# Patient Record
Sex: Male | Born: 1995 | Race: Black or African American | Hispanic: No | Marital: Single | Smoking: Never smoker
Health system: Southern US, Community
[De-identification: ages and names within clinical notes are randomized; demographics above are authoritative.]

## PROBLEM LIST (undated history)

## (undated) ENCOUNTER — Ambulatory Visit: Admission: EM | Payer: Medicaid Other | Source: Home / Self Care

## (undated) DIAGNOSIS — F909 Attention-deficit hyperactivity disorder, unspecified type: Secondary | ICD-10-CM

## (undated) DIAGNOSIS — J45909 Unspecified asthma, uncomplicated: Secondary | ICD-10-CM

## (undated) DIAGNOSIS — Z9109 Other allergy status, other than to drugs and biological substances: Secondary | ICD-10-CM

---
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
## 2016-02-06 ENCOUNTER — Emergency Department (HOSPITAL_COMMUNITY): Payer: Medicaid Other

## 2016-02-06 ENCOUNTER — Encounter (HOSPITAL_COMMUNITY): Payer: Self-pay | Admitting: Emergency Medicine

## 2016-02-06 ENCOUNTER — Emergency Department (HOSPITAL_COMMUNITY)
Admission: EM | Admit: 2016-02-06 | Discharge: 2016-02-06 | Disposition: A | Discharge status: Home / Self Care | Payer: Medicaid Other | Attending: Emergency Medicine | Admitting: Emergency Medicine

## 2016-02-06 DIAGNOSIS — Z8659 Personal history of other mental and behavioral disorders: Secondary | ICD-10-CM | POA: Diagnosis not present

## 2016-02-06 DIAGNOSIS — J45909 Unspecified asthma, uncomplicated: Secondary | ICD-10-CM | POA: Diagnosis not present

## 2016-02-06 DIAGNOSIS — N50812 Left testicular pain: Secondary | ICD-10-CM | POA: Insufficient documentation

## 2016-02-06 DIAGNOSIS — Z79899 Other long term (current) drug therapy: Secondary | ICD-10-CM | POA: Insufficient documentation

## 2016-02-06 LAB — URINALYSIS, ROUTINE W REFLEX MICROSCOPIC
Bilirubin Urine: NEGATIVE
GLUCOSE, UA: NEGATIVE mg/dL
HGB URINE DIPSTICK: NEGATIVE
Ketones, ur: NEGATIVE mg/dL
LEUKOCYTES UA: NEGATIVE
Nitrite: NEGATIVE
PROTEIN: NEGATIVE mg/dL
SPECIFIC GRAVITY, URINE: 1.029 (ref 1.005–1.030)
pH: 7 (ref 5.0–8.0)

## 2016-02-06 MED ORDER — KETOROLAC TROMETHAMINE 60 MG/2ML IM SOLN
60.0000 mg | Freq: Once | INTRAMUSCULAR | Status: AC
  Administered 2016-02-06: 60 mg via INTRAMUSCULAR
  Filled 2016-02-06: qty 2

## 2016-02-06 MED ORDER — HYDROCODONE-ACETAMINOPHEN 5-325 MG PO TABS
1.0000 | ORAL_TABLET | Freq: Once | ORAL | Status: AC
  Administered 2016-02-06: 1 via ORAL
  Filled 2016-02-06: qty 1

## 2016-02-06 NOTE — ED Notes (Signed)
Gave pt.a urinal to void into.

## 2016-02-06 NOTE — ED Notes (Signed)
Pt verbalizes understanding of instructions. 

## 2016-02-06 NOTE — ED Notes (Signed)
Pt. reports chronic/intermittent left testicular pain for > 1 year , denies injury or urinary discomfort , no penile discharge or fever .

## 2016-02-06 NOTE — ED Notes (Signed)
Patient transported to Ultrasound 

## 2016-02-06 NOTE — ED Provider Notes (Signed)
CSN: [id number]     Arrival date & time 02/06/16  0321 History   First MD Initiated Contact with Patient 02/06/16 0515     Chief Complaint  Patient presents with  . Testicle Pain     (Consider location/radiation/quality/duration/timing/severity/associated sxs/prior Treatment) Patient is a 20 y.o. male presenting with testicular pain.  Testicle Pain This is a new problem. The current episode started more than 1 week ago. The problem occurs every several days. Pertinent negatives include no chest pain, no headaches and no shortness of breath. Nothing aggravates the symptoms. Nothing relieves the symptoms. He has tried nothing for the symptoms. The treatment provided no relief.    Past Medical History  Diagnosis Date  . Asthma   . ADHD (attention deficit hyperactivity disorder)   . Environmental allergies    History reviewed. No pertinent past surgical history. No family history on file. Social History  Substance Use Topics  . Smoking status: Never Smoker   . Smokeless tobacco: Never Used  . Alcohol Use: No    Review of Systems  Respiratory: Negative for shortness of breath.   Cardiovascular: Negative for chest pain.  Genitourinary: Positive for testicular pain.  Neurological: Negative for headaches.  All other systems reviewed and are negative.     Allergies  Review of patient's allergies indicates no known allergies.  Home Medications   Prior to Admission medications   Medication Sig Start Date End Date Taking? Authorizing Provider  BIOTIN PO Take 1 tablet by mouth 3 (three) times daily.   Yes Historical Provider, MD   BP 127/76 mmHg  Pulse 84  Temp(Src) 98.2 F (36.8 C) (Oral)  Resp 16  Ht 5\' 11"  (1.803 m)  Wt 180 lb (81.647 kg)  BMI 25.12 kg/m2  SpO2 99% Physical Exam  Constitutional: He appears well-developed and well-nourished.  HENT:  Head: Normocephalic and atraumatic.  Neck: Normal range of motion.  Cardiovascular: Normal rate.   Pulmonary/Chest:  Effort normal. No respiratory distress.  Abdominal: He exhibits no distension. Hernia confirmed negative in the right inguinal area and confirmed negative in the left inguinal area.  Genitourinary: Right testis shows no mass, no swelling and no tenderness. Right testis is descended. Cremasteric reflex is not absent on the right side. Left testis shows tenderness. Left testis shows no mass and no swelling. Left testis is descended. Cremasteric reflex is not absent on the left side. No penile erythema or penile tenderness.  Musculoskeletal: Normal range of motion.  Lymphadenopathy:       Right: No inguinal adenopathy present.       Left: No inguinal adenopathy present.  Neurological: He is alert.  Nursing note and vitals reviewed.   ED Course  Procedures (including critical care time) Labs Review Labs Reviewed  URINALYSIS, ROUTINE W REFLEX MICROSCOPIC [facility])  GC/CHLAMYDIA PROBE AMP ([place]) NOT [facility]    Imaging Review [place] Scrotum  02/06/2016  CLINICAL DATA:  20 year old male with left testicular pain EXAM: SCROTAL ULTRASOUND DOPPLER ULTRASOUND OF THE TESTICLES TECHNIQUE: Complete ultrasound examination of the testicles, epididymis, and other scrotal structures was performed. Color and spectral Doppler ultrasound were also utilized to evaluate blood flow to the testicles. COMPARISON:  Ultrasound dated 08/22/2014 FINDINGS: Right testicle Measurements: 4.5 x 2.3 x 3.2 cm. No mass or microlithiasis visualized. Left testicle Measurements: 4.5 x 2.0 x 3.5 cm. No mass or microlithiasis visualized. Right epididymis:  Normal in size and appearance. Left epididymis:  Normal in size and appearance. Hydrocele:  None visualized.  Varicocele:  None visualized. Pulsed Doppler interrogation of both testes demonstrates normal low resistance arterial and venous waveforms bilaterally. IMPRESSION: Unremarkable testicular ultrasound. Electronically Signed   By: Elgie Collard M.D.   On: 02/06/2016 07:08    [place] Art/ven Flow Abd Pelv Doppler  02/06/2016  CLINICAL DATA:  20 year old male with left testicular pain EXAM: SCROTAL ULTRASOUND DOPPLER ULTRASOUND OF THE TESTICLES TECHNIQUE: Complete ultrasound examination of the testicles, epididymis, and other scrotal structures was performed. Color and spectral Doppler ultrasound were also utilized to evaluate blood flow to the testicles. COMPARISON:  Ultrasound dated 08/22/2014 FINDINGS: Right testicle Measurements: 4.5 x 2.3 x 3.2 cm. No mass or microlithiasis visualized. Left testicle Measurements: 4.5 x 2.0 x 3.5 cm. No mass or microlithiasis visualized. Right epididymis:  Normal in size and appearance. Left epididymis:  Normal in size and appearance. Hydrocele:  None visualized. Varicocele:  None visualized. Pulsed Doppler interrogation of both testes demonstrates normal low resistance arterial and venous waveforms bilaterally. IMPRESSION: Unremarkable testicular ultrasound. Electronically Signed   By: Elgie Collard M.D.   On: 02/06/2016 07:08   I have personally reviewed and evaluated these images and lab results as part of my medical decision-making.   EKG Interpretation None      MDM   Final diagnoses:  Left testicular pain   Intermittent left testicular pain x months. Exam ok, but with moderate pain right now so will tx pain and get [place] to eval for torsion/masses. if negative, will dc with urology follow up.   Negative workup. Will fu w/ urology if persistent symptoms.   New Prescriptions: New Prescriptions   No medications on file     I have personally and contemperaneously reviewed labs and imaging and used in my decision making as above.   A medical screening exam was performed and I feel the patient has had an appropriate workup for their chief complaint at this time and likelihood of emergent condition existing is low. Their vital signs are stable. They have been counseled on decision, discharge, follow up and which symptoms  necessitate immediate return to the emergency department.  They verbally stated understanding and agreement with plan and discharged in stable condition.       Marily Memos, MD 02/06/16 [phone]

## 2016-02-09 LAB — GC/CHLAMYDIA PROBE AMP (~~LOC~~) NOT AT ARMC
CHLAMYDIA, DNA PROBE: NEGATIVE
NEISSERIA GONORRHEA: NEGATIVE

## 2016-03-24 DIAGNOSIS — S42201A Unspecified fracture of upper end of right humerus, initial encounter for closed fracture: Secondary | ICD-10-CM | POA: Insufficient documentation

## 2016-03-24 DIAGNOSIS — X509XXA Other and unspecified overexertion or strenuous movements or postures, initial encounter: Secondary | ICD-10-CM | POA: Diagnosis not present

## 2016-03-24 DIAGNOSIS — Y9372 Activity, wrestling: Secondary | ICD-10-CM | POA: Insufficient documentation

## 2016-03-24 DIAGNOSIS — S43014A Anterior dislocation of right humerus, initial encounter: Secondary | ICD-10-CM | POA: Diagnosis not present

## 2016-03-24 DIAGNOSIS — J45909 Unspecified asthma, uncomplicated: Secondary | ICD-10-CM | POA: Insufficient documentation

## 2016-03-24 DIAGNOSIS — Y998 Other external cause status: Secondary | ICD-10-CM | POA: Insufficient documentation

## 2016-03-24 DIAGNOSIS — Z8659 Personal history of other mental and behavioral disorders: Secondary | ICD-10-CM | POA: Insufficient documentation

## 2016-03-24 DIAGNOSIS — Y92009 Unspecified place in unspecified non-institutional (private) residence as the place of occurrence of the external cause: Secondary | ICD-10-CM | POA: Diagnosis not present

## 2016-03-24 DIAGNOSIS — S4991XA Unspecified injury of right shoulder and upper arm, initial encounter: Secondary | ICD-10-CM | POA: Diagnosis present

## 2016-03-25 ENCOUNTER — Encounter (HOSPITAL_COMMUNITY): Payer: Self-pay | Admitting: Emergency Medicine

## 2016-03-25 ENCOUNTER — Emergency Department (HOSPITAL_COMMUNITY)
Admission: EM | Admit: 2016-03-25 | Discharge: 2016-03-25 | Disposition: A | Discharge status: Home / Self Care | Payer: Medicaid Other | Attending: Emergency Medicine | Admitting: Emergency Medicine

## 2016-03-25 ENCOUNTER — Emergency Department (HOSPITAL_COMMUNITY): Payer: Medicaid Other

## 2016-03-25 DIAGNOSIS — S43004A Unspecified dislocation of right shoulder joint, initial encounter: Secondary | ICD-10-CM

## 2016-03-25 DIAGNOSIS — M21821 Other specified acquired deformities of right upper arm: Secondary | ICD-10-CM

## 2016-03-25 MED ORDER — FENTANYL CITRATE (PF) 100 MCG/2ML IJ SOLN
100.0000 ug | Freq: Once | INTRAMUSCULAR | Status: AC
  Administered 2016-03-25: 100 ug via INTRAVENOUS
  Filled 2016-03-25: qty 2

## 2016-03-25 MED ORDER — HYDROCODONE-ACETAMINOPHEN 5-325 MG PO TABS: 1.0000 | ORAL_TABLET | Freq: Four times a day (QID) | ORAL | Status: DC | PRN

## 2016-03-25 MED ORDER — LIDOCAINE HCL (PF) 1 % IJ SOLN
10.0000 mL | Freq: Once | INTRAMUSCULAR | Status: AC
  Administered 2016-03-25: 10 mL via INTRADERMAL
  Filled 2016-03-25: qty 10

## 2016-03-25 MED ORDER — FENTANYL CITRATE (PF) 100 MCG/2ML IJ SOLN
50.0000 ug | Freq: Once | INTRAMUSCULAR | Status: AC
  Administered 2016-03-25: 50 ug via INTRAVENOUS
  Filled 2016-03-25: qty 2

## 2016-03-25 NOTE — ED Notes (Signed)
Pt. dislocated his right shoulder while playing wrestling at home this evening , presents with swelling/deformity and pain .

## 2016-03-25 NOTE — Discharge Instructions (Signed)
Shoulder Dislocation °A shoulder dislocation happens when the upper arm bone (humerus) moves out of the shoulder joint. The shoulder joint is the part of the shoulder where the humerus, shoulder blade (scapula), and collarbone (clavicle) meet. °CAUSES °This condition is often caused by: °· A fall. °· A hit to the shoulder. °· A forceful movement of the shoulder. °RISK FACTORS °This condition is more likely to develop in people who play sports. °SYMPTOMS °Symptoms of this condition include: °· Deformity of the shoulder. °· Intense pain. °· Inability to move the shoulder. °· Numbness, weakness, or tingling in your neck or down your arm. °· Bruising or swelling around your shoulder. °DIAGNOSIS °This condition is diagnosed with a physical exam. After the exam, tests may be done to check for related problems. Tests that may be done include: °· X-ray. This may be done to check for broken bones. °· MRI. This may be done to check for damage to the tissues around the shoulder. °· Electromyogram. This may be done to check for nerve damage. °TREATMENT °This condition is treated with a procedure to place the humerus back in the joint. This procedure is called a reduction. There are two types of reduction: °· Closed reduction. In this procedure, the humerus is placed back in the joint without surgery. The health care provider uses his or her hands to guide the bone back into place. °· Open reduction. In this procedure, the humerus is placed back in the joint with surgery. An open reduction may be recommended if: °¨ You have a weak shoulder joint or weak ligaments. °¨ You have had more than one shoulder dislocation. °¨ The nerves or blood vessels around your shoulder have been damaged. °After the humerus is placed back into the joint, your arm will be placed in a splint or sling to prevent it from moving. You will need to wear the splint or sling until your shoulder heals. When the splint or sling is removed, you may have  physical therapy to help improve the range of motion in your shoulder joint. °HOME CARE INSTRUCTIONS °If You Have a Splint or Sling: °· Wear it as told by your health care provider. Remove it only as told by your health care provider. °· Loosen it if your fingers become numb and tingle, or if they turn cold and blue. °· Keep it clean and dry. °Bathing °· Do not take baths, swim, or use a hot tub until your health care provider approves. Ask your health care provider if you can take showers. You may only be allowed to take sponge baths for bathing. °· If your health care provider approves bathing and showering, cover your splint or sling with a watertight plastic bag to protect it from water. Do not let the splint or sling get wet. °Managing Pain, Stiffness, and Swelling °· If directed, apply ice to the injured area. °¨ Put ice in a plastic bag. °¨ Place a towel between your skin and the bag. °¨ Leave the ice on for 20 minutes, 2-3 times per day. °· Move your fingers often to avoid stiffness and to decrease swelling. °· Raise (elevate) the injured area above the level of your heart while you are sitting or lying down. °Driving °· Do not drive while wearing a splint or sling on a hand that you use for driving. °· Do not drive or operate heavy machinery while taking pain medicine. °Activity °· Return to your normal activities as told by your health care provider. Ask your   health care provider what activities are safe for you. °· Perform range-of-motion exercises only as told by your health care provider. °· Exercise your hand by squeezing a soft ball. This helps to decrease stiffness and swelling in your hand and wrist. °General Instructions °· Take over-the-counter and prescription medicines only as told by your health care provider. °· Do not use any tobacco products, including cigarettes, chewing tobacco, or e-cigarettes. Tobacco can delay bone and tissue healing. If you need help quitting, ask your health care  provider. °· Keep all follow-up visits as told by your health care provider. This is important. °SEEK MEDICAL CARE IF: °· Your splint or sling gets damaged. °SEEK IMMEDIATE MEDICAL CARE IF: °· Your pain gets worse rather than better. °· You lose feeling in your arm or hand. °· Your arm or hand becomes white and cold. °  °This information is not intended to replace advice given to you by your health care provider. Make sure you discuss any questions you have with your health care provider. °  °Document Released: 07/27/2001 Document Revised: 07/23/2015 Document Reviewed: 02/24/2015 °Elsevier Interactive Patient Education ©2016 [employer] ° °

## 2016-03-25 NOTE — ED Provider Notes (Signed)
Reduction of dislocation Date/Time: 1:41 AM Performed by: Sofie RowerStengel, Gabryel Files Authorized by: Sofie RowerStengel, Marge Vandermeulen Consent: Verbal consent obtained. Risks and benefits: risks, benefits and alternatives were discussed Consent given by: patient Required items: required blood products, implants, devices, and special equipment available Time out: Immediately prior to procedure a "time out" was called to verify the correct patient, procedure, equipment, support staff and site/side marked as required.  Patient sedated: no  Vitals: Vital signs were monitored during sedation. Patient tolerance: Patient tolerated the procedure well with no immediate complications. Joint: right shoulder Reduction technique: traction-countertraction.  Abduction, external rotation unsuccessful.  Reduced with gentle axial traction.    Successfully reduced with XR confirmation.  Joint Injection Procedure: right shoulder intra-articluar injection of 1% lidocaine 8mL, Procedure explained in detail with risk and benefits. consent obtained verbally, time out done, site preped with betadine and alcohol. 21G needed used to inject solumedrol with lidocaine. Pain improved.       Sofie RowerMike Clementine Soulliere, MD 03/25/16 0144  Laurence Spatesachel Morgan Little, MD 03/25/16 [phone]

## 2016-03-25 NOTE — ED Provider Notes (Signed)
CSN: [id number]     Arrival date & time 03/24/16  2354 History  By signing my name below, I, Phillis HaggisGabriella Gaje, attest that this documentation has been prepared under the direction and in the presence of Laurence Spatesachel Morgan Faven Watterson, MD. Electronically Signed: Phillis HaggisGabriella Gaje, ED Scribe. 03/25/2016. 12:25 AM.   Chief Complaint  Patient presents with  . Dislocation   The history is provided by the patient. No language interpreter was used.  HPI Comments: Shawn [name] is a 20 y.o. male who presents to the Emergency Department complaining of a right shoulder injury onset PTA. Pt was wrestling when he dislocated his shoulder with lifting his arm above his head; pt heard a "pop" in the joint secondary to this action. Pt has associated swelling, deformity, and pain to the area. Pt last ate before 6 PM this evening. He denies a hx of similar symptoms or other injuries. Pt denies nausea, numbness or weakness. Pt denies allergies to medications.  Past Medical History  Diagnosis Date  . Asthma   . ADHD (attention deficit hyperactivity disorder)   . Environmental allergies    History reviewed. No pertinent past surgical history. No family history on file. Social History  Substance Use Topics  . Smoking status: Never Smoker   . Smokeless tobacco: Never Used  . Alcohol Use: No    Review of Systems 10 Systems reviewed and all are negative for acute change except as noted in the HPI.  Allergies  Review of patient's allergies indicates no known allergies.  Home Medications   Prior to Admission medications   Medication Sig Start Date End Date Taking? Authorizing Provider  HYDROcodone-acetaminophen (NORCO/VICODIN) 5-325 MG tablet Take 1-2 tablets by mouth every 6 (six) hours as needed for severe pain. 03/25/16   Ambrose Finlandachel Morgan Christyann Manolis, MD   BP 129/89 mmHg  Pulse 77  Temp(Src) 97.6 F (36.4 C) (Oral)  Resp 18  Ht 5\' 11"  (1.803 m)  Wt 180 lb (81.647 kg)  BMI 25.12 kg/m2  SpO2 99% Physical Exam   Constitutional: He is oriented to person, place, and time. He appears well-developed and well-nourished.  In mild distress due to pain  HENT:  Head: Normocephalic and atraumatic.  Moist mucous membranes  Eyes: Conjunctivae are normal.  Neck: Neck supple.  Cardiovascular: Normal rate, regular rhythm, normal heart sounds and intact distal pulses.   No murmur heard. Pulmonary/Chest: Effort normal and breath sounds normal.  Abdominal: Soft. Bowel sounds are normal. He exhibits no distension. There is no tenderness.  Musculoskeletal: He exhibits tenderness. He exhibits no edema.       Right shoulder: He exhibits deformity.  R shoulder squared off with arm held in partial abduction; normal sensation and grip strength R hand  Neurological: He is alert and oriented to person, place, and time.  Fluent speech, normal sensation throughout  Skin: Skin is warm and dry. No pallor.  Psychiatric: He has a normal mood and affect. Judgment normal.  Nursing note and vitals reviewed.   ED Course  Procedures (including critical care time) DIAGNOSTIC STUDIES: Oxygen Saturation is 99% on RA, normal by my interpretation.    COORDINATION OF CARE: 12:24 AM-Discussed treatment plan which includes x-ray with pt at bedside and pt agreed to plan.    Labs Review Labs Reviewed - No data to display  Imaging Review Dg Shoulder Right  03/25/2016  CLINICAL DATA:  Wrestling injury. EXAM: RIGHT SHOULDER - 2+ VIEW COMPARISON:  None. FINDINGS: Two views of the right shoulder demonstrates a subcoracoid  anterior right glenohumeral dislocation. No fracture is evident. IMPRESSION: Right shoulder dislocation Electronically Signed   By: Ellery Plunk M.D.   On: 03/25/2016 01:02   Dg Shoulder Right Port  03/25/2016  CLINICAL DATA:  Right shoulder disc EXAM: PORTABLE RIGHT SHOULDER - 2+ VIEW COMPARISON:  03/25/2016 at 00:48 FINDINGS: Two views of the right shoulder confirm successful reduction of the glenohumeral  dislocation. There is a Hill-Sachs deformity of the humeral head. Glenoid appears intact. IMPRESSION: Successful reduction.  Hill-Sachs deformity. Electronically Signed   By: Ellery Plunk M.D.   On: 03/25/2016 01:51   I have personally reviewed and evaluated these images as part of my medical decision-making.  Medications  fentaNYL (SUBLIMAZE) injection 50 mcg (50 mcg Intravenous Given 03/25/16 0032)  lidocaine (PF) (XYLOCAINE) 1 % injection 10 mL (10 mLs Intradermal Given 03/25/16 0058)  fentaNYL (SUBLIMAZE) injection 100 mcg (100 mcg Intravenous Given 03/25/16 0113)     MDM   Final diagnoses:  Shoulder dislocation, right, initial encounter  Hill Sachs deformity, right   Pt w/ R shoulder injury during wrestling at home. He was neurovascularly intact. Plain films show anterior dislocation. Discussed risks and benefits of reduction and options for sedation vs no sedation. Pt and mother voiced understanding and gave permission for reduction w/ lidocaine block and fentanyl. Gave fentanyl after which Dr. Edman Circle performed intra-articular lidocaine injection. Dr. Edman Circle reduced shoulder with traction/countertraction and external rotation/ abduction; see separate procedure note for details. Joint successfully reduced and repeat films show relocated joint; Hill-Sachs deformity noted. Placed patient in shoulder sling and discussed follow-up with orthopedics. Provided with follow-up information. Reviewed supportive care instructions as well as return precautions. Patient discharged in satisfactory condition.  I personally performed the services described in this documentation, which was scribed in my presence. The recorded information has been reviewed and is accurate.    Laurence Spates, MD 03/25/16 [phone]

## 2016-03-25 NOTE — ED Notes (Signed)
Gave pt water and gram crackers.

## 2016-03-25 NOTE — Progress Notes (Signed)
Orthopedic Tech Progress Note Patient Details:  Shawn [name] 05-06-1996 [id number]  Ortho Devices Type of Ortho Device: Sling immobilizer Ortho Device/Splint Location: rue Ortho Device/Splint Interventions: Ordered, Application   [name], [name] 03/25/2016, 2:10 AM

## 2017-07-22 ENCOUNTER — Emergency Department (HOSPITAL_COMMUNITY): Payer: Medicaid Other

## 2017-07-22 ENCOUNTER — Emergency Department (HOSPITAL_COMMUNITY)
Admission: EM | Admit: 2017-07-22 | Discharge: 2017-07-22 | Disposition: A | Discharge status: Home / Self Care | Payer: Medicaid Other | Attending: Emergency Medicine | Admitting: Emergency Medicine

## 2017-07-22 ENCOUNTER — Encounter (HOSPITAL_COMMUNITY): Payer: Self-pay | Admitting: *Deleted

## 2017-07-22 DIAGNOSIS — M25511 Pain in right shoulder: Secondary | ICD-10-CM | POA: Insufficient documentation

## 2017-07-22 DIAGNOSIS — Z5321 Procedure and treatment not carried out due to patient leaving prior to being seen by health care provider: Secondary | ICD-10-CM | POA: Diagnosis not present

## 2017-07-22 NOTE — ED Notes (Signed)
NS called and states that pt's mother called and said that pt popped shoulder back into place and they have already left.

## 2017-07-22 NOTE — ED Triage Notes (Signed)
Pt in c/o R shoulder dislocation with the hx of the same, pt reports playing ball today and it popped without contact injury, radial pulse intact, skin intact, pt A&O x4

## 2017-10-18 ENCOUNTER — Emergency Department (HOSPITAL_COMMUNITY)
Admission: EM | Admit: 2017-10-18 | Discharge: 2017-10-18 | Disposition: A | Discharge status: Home / Self Care | Payer: Medicaid Other | Attending: Emergency Medicine | Admitting: Emergency Medicine

## 2017-10-18 ENCOUNTER — Encounter (HOSPITAL_COMMUNITY): Payer: Self-pay | Admitting: Emergency Medicine

## 2017-10-18 ENCOUNTER — Other Ambulatory Visit: Payer: Self-pay

## 2017-10-18 DIAGNOSIS — H6691 Otitis media, unspecified, right ear: Secondary | ICD-10-CM | POA: Diagnosis not present

## 2017-10-18 DIAGNOSIS — H9201 Otalgia, right ear: Secondary | ICD-10-CM | POA: Diagnosis present

## 2017-10-18 DIAGNOSIS — J45909 Unspecified asthma, uncomplicated: Secondary | ICD-10-CM | POA: Insufficient documentation

## 2017-10-18 MED ORDER — AMOXICILLIN 500 MG PO CAPS
500.0000 mg | ORAL_CAPSULE | Freq: Once | ORAL | Status: AC
  Administered 2017-10-18: 500 mg via ORAL
  Filled 2017-10-18: qty 1

## 2017-10-18 MED ORDER — AMOXICILLIN 500 MG PO CAPS: 500.0000 mg | ORAL_CAPSULE | Freq: Three times a day (TID) | ORAL | 0 refills | Status: DC

## 2017-10-18 NOTE — ED Provider Notes (Signed)
[facility] Provider Note   CSN: [id number] Arrival date & time: 10/18/17  0048     History   Chief Complaint Chief Complaint  Patient presents with  . Otalgia    HPI Shawn [name] is a 21 y.o. male.  The history is provided by the patient and medical records.  Otalgia     21 year old male with history of ADHD, asthma, allergies, presenting to the ED with right ear pain.  States earlier today he noticed some "itching" in his ear that progressed to pain and has worsened throughout the day.  States it feels like there is fluid behind his ear.  States hearing seems normal.  He has not had any fever or chills.  No intervention tried prior to arrival.  Past Medical History:  Diagnosis Date  . ADHD (attention deficit hyperactivity disorder)   . Asthma   . Environmental allergies     There are no active problems to display for this patient.   History reviewed. No pertinent surgical history.     Home Medications    Prior to Admission medications   Medication Sig Start Date End Date Taking? Authorizing Provider  amoxicillin (AMOXIL) 500 MG capsule Take 1 capsule (500 mg total) by mouth 3 (three) times daily. 10/18/17   Garlon HatchetSanders, Lisa M, PA-C  HYDROcodone-acetaminophen (NORCO/VICODIN) 5-325 MG tablet Take 1-2 tablets by mouth every 6 (six) hours as needed for severe pain. 03/25/16   Little, Ambrose Finlandachel Morgan, MD    Family History No family history on file.  Social History Social History   Tobacco Use  . Smoking status: Never Smoker  . Smokeless tobacco: Never Used  Substance Use Topics  . Alcohol use: No  . Drug use: No     Allergies   Patient has no known allergies.   Review of Systems Review of Systems  HENT: Positive for ear pain.   All other systems reviewed and are negative.    Physical Exam Updated Vital Signs BP 130/61 (BP Location: Right Arm)   Pulse 68   Temp 97.6 F (36.4 C) (Oral)   Resp 16   SpO2 100%    Physical Exam  Constitutional: He is oriented to person, place, and time. He appears well-developed and well-nourished.  HENT:  Head: Normocephalic and atraumatic.  Mouth/Throat: Oropharynx is clear and moist.  Right TM is erythematous, injected with effusion present; no signs of perforation, mastoid non-tender  Eyes: Conjunctivae and EOM are normal. Pupils are equal, round, and reactive to light.  Neck: Normal range of motion.  Cardiovascular: Normal rate, regular rhythm and normal heart sounds.  Pulmonary/Chest: Effort normal and breath sounds normal.  Abdominal: Soft. Bowel sounds are normal.  Musculoskeletal: Normal range of motion.  Neurological: He is alert and oriented to person, place, and time.  Skin: Skin is warm and dry.  Psychiatric: He has a normal mood and affect.  Nursing note and vitals reviewed.    ED Treatments / Results  Labs (all labs ordered are listed, but only abnormal results are displayed) Labs Reviewed - No data to display  EKG  EKG Interpretation None       Radiology No results found.  Procedures Procedures (including critical care time)  Medications Ordered in ED Medications  amoxicillin (AMOXIL) capsule 500 mg (not administered)     Initial Impression / Assessment and Plan / ED Course  I have reviewed the triage vital signs and the nursing notes.  Pertinent labs & imaging results that  were available during my care of the patient were reviewed by me and considered in my medical decision making (see chart for details).  21 year old male here with right ear pain.  On exam appears to have right otitis media without complicating features of TM perforation or mastoiditis.  Will treat with amoxicillin.  Close follow-up with PCP.  Discussed plan with patient, he acknowledged understanding and agreed with plan of care.  Return precautions given for new or worsening symptoms.  Final Clinical Impressions(s) / ED Diagnoses   Final diagnoses:   Acute infection of right ear    ED Discharge Orders        Ordered    amoxicillin (AMOXIL) 500 MG capsule  3 times daily     10/18/17 0112       Garlon HatchetSanders, Lisa M, PA-C 10/18/17 0143    Shon BatonHorton, Courtney F, MD 10/18/17 [phone]

## 2017-10-18 NOTE — Discharge Instructions (Signed)
Take the prescribed medication as directed. Dont stick anything down in your ear. Follow-up with your primary care doctor if any ongoing issues. Return to the ED for new or worsening symptoms.

## 2017-10-18 NOTE — ED Notes (Signed)
ED Provider at bedside. 

## 2017-10-18 NOTE — ED Triage Notes (Signed)
Patient with right ear pain.  Patient states that he feels like there could fluid behind his eardrum.  He states that he has had some minor cold symptoms.

## 2017-10-22 ENCOUNTER — Encounter (HOSPITAL_COMMUNITY): Payer: Self-pay

## 2017-10-22 ENCOUNTER — Emergency Department (HOSPITAL_COMMUNITY)
Admission: EM | Admit: 2017-10-22 | Discharge: 2017-10-22 | Disposition: A | Discharge status: Home / Self Care | Payer: Medicaid Other | Attending: Emergency Medicine | Admitting: Emergency Medicine

## 2017-10-22 DIAGNOSIS — H9201 Otalgia, right ear: Secondary | ICD-10-CM | POA: Insufficient documentation

## 2017-10-22 DIAGNOSIS — Z5321 Procedure and treatment not carried out due to patient leaving prior to being seen by health care provider: Secondary | ICD-10-CM | POA: Insufficient documentation

## 2017-10-22 NOTE — ED Triage Notes (Signed)
Pt continues to have R ear pain, seen two days ago for the same, reports some drainage now and wants to know if he should clean his ear

## 2017-10-22 NOTE — ED Notes (Signed)
Called for Pt several times throughout the waiting room. No Answer. Also Nurse 1st does not see the Patient in the waiting room.

## 2017-10-26 ENCOUNTER — Other Ambulatory Visit: Payer: Self-pay

## 2017-10-26 ENCOUNTER — Ambulatory Visit (HOSPITAL_COMMUNITY)
Admission: EM | Admit: 2017-10-26 | Discharge: 2017-10-26 | Disposition: A | Discharge status: Home / Self Care | Payer: Medicaid Other | Attending: Family Medicine | Admitting: Family Medicine

## 2017-10-26 DIAGNOSIS — H9201 Otalgia, right ear: Secondary | ICD-10-CM

## 2017-10-26 NOTE — Discharge Instructions (Signed)
Recommend trying an allergy medication for the next 1-2 weeks. If not showing improvement, you may need to see and ear, nose, and throat specialist. Continue Tylenol and ibuprofen as needed.

## 2017-10-26 NOTE — ED Triage Notes (Signed)
Per pt his ear is hurt, per pt his ear is pulsating, per pt he has a sever headache, per pt he had an ear infecting, per pt his sound is sound his muffle

## 2017-10-26 NOTE — ED Provider Notes (Signed)
  [facility]   [id number] 10/26/17 Arrival Time: 1000  ASSESSMENT & PLAN:  1. Otalgia of right ear    Will finish amoxicillin. Likely some eustachian tube dysfunction. Rec OTC allergy medication for 1-2 weeks. If not showing improvement may benefit from ENT evaluation. May f/u here as needed.  OTC symptom care as needed.  Reviewed expectations re: course of current medical issues. Questions answered. Outlined signs and symptoms indicating need for more acute intervention. Patient verbalized understanding. After Visit Summary given.   SUBJECTIVE:  Shawn [name] is a 21 y.o. male who presents with complaint of R ear "pressure feeling." Seen in the ED on Dec 4 and started on amoxicillin. No worsening. Thinks some improvement. "But still have a pressure feeling in my ear." No ear drainage. Ibuprofen with mild help. No specific aggravating or alleviating factors reported.  Social History   Tobacco Use  Smoking Status Never Smoker  Smokeless Tobacco Never Used    ROS: As per HPI.   OBJECTIVE:  Vitals:   10/26/17 1024  BP: (!) 142/92  Pulse: 66  Temp: 97.6 F (36.4 C)  TempSrc: Oral  SpO2: 98%     General appearance: alert; no distress HEENT: nasal congestion; clear runny nose; throat irritation secondary to post-nasal drainage; R TM with some fluid behind but does not show signs of infection; TM is intact Neck: supple without LAD Lungs: clear to auscultation bilaterally; Skin: warm and dry Psychological: alert and cooperative; normal mood and affect  No Known Allergies  Past Medical History:  Diagnosis Date  . ADHD (attention deficit hyperactivity disorder)   . Asthma   . Environmental allergies     Social History   Socioeconomic History  . Marital status: Single    Spouse name: Not on file  . Number of children: Not on file  . Years of education: Not on file  . Highest education level: Not on file  Social Needs  . Financial resource strain:  Not on file  . Food insecurity - worry: Not on file  . Food insecurity - inability: Not on file  . Transportation needs - medical: Not on file  . Transportation needs - non-medical: Not on file  Occupational History  . Not on file  Tobacco Use  . Smoking status: Never Smoker  . Smokeless tobacco: Never Used  Substance and Sexual Activity  . Alcohol use: No  . Drug use: No  . Sexual activity: Not on file  Other Topics Concern  . Not on file  Social History Narrative  . Not on file           Mardella LaymanHagler, Sudeep Scheibel, MD 10/26/17 1144

## 2017-11-24 ENCOUNTER — Encounter (HOSPITAL_COMMUNITY): Payer: Self-pay

## 2017-11-24 ENCOUNTER — Other Ambulatory Visit: Payer: Self-pay

## 2017-11-24 ENCOUNTER — Ambulatory Visit (HOSPITAL_COMMUNITY)
Admission: EM | Admit: 2017-11-24 | Discharge: 2017-11-24 | Disposition: A | Discharge status: Home / Self Care | Payer: Medicaid Other | Attending: Family Medicine | Admitting: Family Medicine

## 2017-11-24 DIAGNOSIS — Z202 Contact with and (suspected) exposure to infections with a predominantly sexual mode of transmission: Secondary | ICD-10-CM | POA: Insufficient documentation

## 2017-11-24 DIAGNOSIS — Z113 Encounter for screening for infections with a predominantly sexual mode of transmission: Secondary | ICD-10-CM

## 2017-11-24 MED ORDER — METRONIDAZOLE 500 MG PO TABS: 2000.0000 mg | ORAL_TABLET | Freq: Once | ORAL | 0 refills | Status: AC

## 2017-11-24 MED ORDER — CEFTRIAXONE SODIUM 250 MG IJ SOLR
INTRAMUSCULAR | Status: AC
  Filled 2017-11-24: qty 250

## 2017-11-24 MED ORDER — AZITHROMYCIN 250 MG PO TABS
ORAL_TABLET | ORAL | Status: AC
  Filled 2017-11-24: qty 4

## 2017-11-24 MED ORDER — CEFTRIAXONE SODIUM 250 MG IJ SOLR
250.0000 mg | Freq: Once | INTRAMUSCULAR | Status: AC
  Administered 2017-11-24: 250 mg via INTRAMUSCULAR

## 2017-11-24 MED ORDER — AZITHROMYCIN 250 MG PO TABS
1000.0000 mg | ORAL_TABLET | Freq: Once | ORAL | Status: AC
  Administered 2017-11-24: 1000 mg via ORAL

## 2017-11-24 NOTE — Discharge Instructions (Signed)
You were treated empirically for gonorrhea, chlamydia, trichomonas. Azithromycin 1g by mouth and Rocephin 250mg  injection given in office today.  Flagyl as directed.  Refrain from alcohol use for the next 7 days.  Blood work and cytology sent, you will be contacted with any positive results that requires further treatment. Refrain from sexual activity for the next 7 days. Monitor for any worsening of symptoms, fever, abdominal pain, nausea, vomiting, testicular pain/swelling, penile lesion, penile pain to follow up for reevaluation.

## 2017-11-24 NOTE — ED Provider Notes (Signed)
[facility]    CSN: [id number] Arrival date & time: 11/24/17  1736     History   Chief Complaint Chief Complaint  Patient presents with  . SEXUALLY TRANSMITTED DISEASE    HPI Radene GunningSaiquan Rajewski is a 22 y.o. male.   22 year old male comes in for STD evaluation.  Patient is asymptomatic.  Specifically denies penile lesion, discharge, dysuria.  Denies abdominal pain, nausea, vomiting.  Denies fever, chills, night sweats.  Sexually active with one male partner, no condom use.      Past Medical History:  Diagnosis Date  . ADHD (attention deficit hyperactivity disorder)   . Asthma   . Environmental allergies     There are no active problems to display for this patient.   History reviewed. No pertinent surgical history.     Home Medications    Prior to Admission medications   Medication Sig Start Date End Date Taking? Authorizing Provider  amoxicillin (AMOXIL) 500 MG capsule Take 1 capsule (500 mg total) by mouth 3 (three) times daily. 10/18/17   Garlon HatchetSanders, Lisa M, PA-C  Diphenhydramine-APAP, sleep, (TYLENOL PM EXTRA STRENGTH PO) Take by mouth.    [provider]  HYDROcodone-acetaminophen (NORCO/VICODIN) 5-325 MG tablet Take 1-2 tablets by mouth every 6 (six) hours as needed for severe pain. 03/25/16   Little, Ambrose Finlandachel Morgan, MD  metroNIDAZOLE (FLAGYL) 500 MG tablet Take 4 tablets (2,000 mg total) by mouth once for 1 dose. 11/24/17 11/24/17  Belinda FisherYu, Amy V, PA-C    Family History History reviewed. No pertinent family history.  Social History Social History   Tobacco Use  . Smoking status: Never Smoker  . Smokeless tobacco: Never Used  Substance Use Topics  . Alcohol use: No  . Drug use: No     Allergies   Patient has no known allergies.   Review of Systems Review of Systems  Reason unable to perform ROS: See HPI as above.     Physical Exam Triage Vital Signs ED Triage Vitals [11/24/17 1836]  Enc Vitals Group     BP 140/73     Pulse Rate 77      Resp      Temp 98 F (36.7 C)     Temp Source Oral     SpO2 100 %     Weight      Height      Head Circumference      Peak Flow      Pain Score      Pain Loc      Pain Edu?      Excl. in GC?    No data found.  Updated Vital Signs BP 140/73 (BP Location: Left Arm)   Pulse 77   Temp 98 F (36.7 C) (Oral)   SpO2 100%   Physical Exam  Constitutional: He is oriented to person, place, and time. He appears well-developed and well-nourished. No distress.  HENT:  Head: Normocephalic and atraumatic.  Eyes: Conjunctivae are normal. Pupils are equal, round, and reactive to light.  Genitourinary: Testes normal and penis normal. Circumcised.  Genitourinary Comments: No lesions/ulcers seen. No tenderness to palpation. No discharge seen.   Neurological: He is alert and oriented to person, place, and time.     UC Treatments / Results  Labs (all labs ordered are listed, but only abnormal results are displayed) Labs Reviewed  HIV ANTIBODY (ROUTINE TESTING)  RPR  URINE CYTOLOGY ANCILLARY ONLY    EKG  EKG Interpretation None  Radiology No results found.  Procedures Procedures (including critical care time)  Medications Ordered in UC Medications  cefTRIAXone (ROCEPHIN) injection 250 mg (250 mg Intramuscular Given 11/24/17 1915)  azithromycin (ZITHROMAX) tablet 1,000 mg (1,000 mg Oral Given 11/24/17 1915)     Initial Impression / Assessment and Plan / UC Course  I have reviewed the triage vital signs and the nursing notes.  Pertinent labs & imaging results that were available during my care of the patient were reviewed by me and considered in my medical decision making (see chart for details).    Patient was treated empirically for gonorrhea, chlamydia, trichomonas.  Azithromycin and Rocephin given in office today.  Flagyl as directed.  Blood work and cytology sent, patient will be contacted with any positive results that require additional treatment. Patient to  refrain from sexual activity for the next 7 days. Return precautions given.    Final Clinical Impressions(s) / UC Diagnoses   Final diagnoses:  Possible exposure to STD    ED Discharge Orders        Ordered    metroNIDAZOLE (FLAGYL) 500 MG tablet   Once     11/24/17 1910        Lurline Idol 11/24/17 [phone]

## 2017-11-24 NOTE — ED Triage Notes (Signed)
[name] pt has no symptoms but thinks he may have been exposed

## 2017-11-25 LAB — HIV ANTIBODY (ROUTINE TESTING W REFLEX): HIV SCREEN 4TH GENERATION: NONREACTIVE

## 2017-11-25 LAB — URINE CYTOLOGY ANCILLARY ONLY
CHLAMYDIA, DNA PROBE: NEGATIVE
Neisseria Gonorrhea: NEGATIVE
TRICH (WINDOWPATH): NEGATIVE

## 2017-11-25 LAB — RPR: RPR: NONREACTIVE

## 2018-01-04 ENCOUNTER — Encounter (HOSPITAL_COMMUNITY): Payer: Self-pay | Admitting: Emergency Medicine

## 2018-01-04 ENCOUNTER — Other Ambulatory Visit: Payer: Self-pay

## 2018-01-04 ENCOUNTER — Ambulatory Visit (HOSPITAL_COMMUNITY)
Admission: EM | Admit: 2018-01-04 | Discharge: 2018-01-04 | Disposition: A | Discharge status: Home / Self Care | Payer: Medicaid Other | Attending: Family Medicine | Admitting: Family Medicine

## 2018-01-04 DIAGNOSIS — Z113 Encounter for screening for infections with a predominantly sexual mode of transmission: Secondary | ICD-10-CM | POA: Diagnosis not present

## 2018-01-04 DIAGNOSIS — Z202 Contact with and (suspected) exposure to infections with a predominantly sexual mode of transmission: Secondary | ICD-10-CM | POA: Diagnosis not present

## 2018-01-04 DIAGNOSIS — R369 Urethral discharge, unspecified: Secondary | ICD-10-CM

## 2018-01-04 DIAGNOSIS — Z711 Person with feared health complaint in whom no diagnosis is made: Secondary | ICD-10-CM

## 2018-01-04 LAB — POCT URINALYSIS DIP (DEVICE)
BILIRUBIN URINE: NEGATIVE
Glucose, UA: NEGATIVE mg/dL
HGB URINE DIPSTICK: NEGATIVE
LEUKOCYTES UA: NEGATIVE
Nitrite: NEGATIVE
Protein, ur: 30 mg/dL — AB
SPECIFIC GRAVITY, URINE: 1.02 (ref 1.005–1.030)
UROBILINOGEN UA: 1 mg/dL (ref 0.0–1.0)
pH: 7 (ref 5.0–8.0)

## 2018-01-04 MED ORDER — AZITHROMYCIN 250 MG PO TABS
1000.0000 mg | ORAL_TABLET | Freq: Once | ORAL | Status: AC
  Administered 2018-01-04: 1000 mg via ORAL

## 2018-01-04 MED ORDER — CEFTRIAXONE SODIUM 250 MG IJ SOLR
INTRAMUSCULAR | Status: AC
  Filled 2018-01-04: qty 250

## 2018-01-04 MED ORDER — CEFTRIAXONE SODIUM 250 MG IJ SOLR
250.0000 mg | Freq: Once | INTRAMUSCULAR | Status: AC
  Administered 2018-01-04: 250 mg via INTRAMUSCULAR

## 2018-01-04 MED ORDER — AZITHROMYCIN 250 MG PO TABS
ORAL_TABLET | ORAL | Status: AC
  Filled 2018-01-04: qty 4

## 2018-01-04 NOTE — ED Provider Notes (Signed)
[facility]   [id number] 01/04/18 Arrival Time: 1546  ASSESSMENT & PLAN:  1. Concern about STD in male without diagnosis     Meds ordered this encounter  Medications  . cefTRIAXone (ROCEPHIN) injection 250 mg  . azithromycin (ZITHROMAX) tablet 1,000 mg   Pending: URINE CYTOLOGY ANCILLARY ONLY   Will notify of any positive results. Instructed to refrain from sexual activity for at least seven days.  Reviewed expectations re: course of current medical issues. Questions answered. Outlined signs and symptoms indicating need for more acute intervention. Patient verbalized understanding. After Visit Summary given.   SUBJECTIVE:  Shawn [name] is a 22 y.o. male who presents with complaint of penile discharge. Onset gradual, 3 days ago. Describes discharge as thick and white/yellow. Urinary symptoms: none. Afebrile. No abdominal or pelvic pain. No n/v. No rashes or lesions. Sexually active with single male partner. OTC treatment: none.  ROS: As per HPI.  OBJECTIVE:  Vitals:   01/04/18 1639  BP: 123/69  Pulse: 70  Resp: 18  Temp: 98.6 F (37 C)  TempSrc: Oral  SpO2: 97%    General appearance: alert, cooperative, appears stated age and no distress Throat: lips, mucosa, and tongue normal; teeth and gums normal Back: no CVA tenderness Abdomen: soft, non-tender; bowel sounds normal; no masses or organomegaly; no guarding or rebound tenderness GU: declines Skin: warm and dry Psychological:  Alert and cooperative. Normal mood and affect.  Results for orders placed or performed during the hospital encounter of 01/04/18  POCT urinalysis dip (device)  Result Value Ref Range   Glucose, UA NEGATIVE NEGATIVE mg/dL   Bilirubin Urine NEGATIVE NEGATIVE   Ketones, ur TRACE (A) NEGATIVE mg/dL   Specific Gravity, Urine 1.020 1.005 - 1.030   Hgb urine dipstick NEGATIVE NEGATIVE   pH 7.0 5.0 - 8.0   Protein, ur 30 (A) NEGATIVE mg/dL   Urobilinogen, UA 1.0 0.0 - 1.0 mg/dL    Nitrite NEGATIVE NEGATIVE   Leukocytes, UA NEGATIVE NEGATIVE    Labs Reviewed  POCT URINALYSIS DIP (DEVICE) - Abnormal; Notable for the following components:      Result Value   Ketones, ur TRACE (*)    Protein, ur 30 (*)    All other components within normal limits  URINE CYTOLOGY ANCILLARY ONLY    No Known Allergies  Past Medical History:  Diagnosis Date  . ADHD (attention deficit hyperactivity disorder)   . Asthma   . Environmental allergies    Family History  Problem Relation Age of Onset  . Healthy Mother    Social History   Socioeconomic History  . Marital status: Single    Spouse name: Not on file  . Number of children: Not on file  . Years of education: Not on file  . Highest education level: Not on file  Social Needs  . Financial resource strain: Not on file  . Food insecurity - worry: Not on file  . Food insecurity - inability: Not on file  . Transportation needs - medical: Not on file  . Transportation needs - non-medical: Not on file  Occupational History  . Not on file  Tobacco Use  . Smoking status: Never Smoker  . Smokeless tobacco: Never Used  Substance and Sexual Activity  . Alcohol use: No  . Drug use: No  . Sexual activity: Not on file  Other Topics Concern  . Not on file  Social History Narrative  . Not on file  Mardella Layman, MD 01/05/18 1006

## 2018-01-04 NOTE — ED Notes (Signed)
Sent to bathroom for a dirty and clean urine with instructions 

## 2018-01-04 NOTE — Discharge Instructions (Signed)

## 2018-01-04 NOTE — ED Notes (Signed)
Patient is in department with girlfriend, both being seen for the same concerns

## 2018-01-04 NOTE — ED Triage Notes (Signed)
Patient requesting std check.  Burning with urination.  Denies discharge.  Symptoms for 4 days

## 2018-01-05 LAB — URINE CYTOLOGY ANCILLARY ONLY
Chlamydia: NEGATIVE
NEISSERIA GONORRHEA: NEGATIVE
TRICH (WINDOWPATH): NEGATIVE

## 2018-05-28 ENCOUNTER — Ambulatory Visit (HOSPITAL_COMMUNITY)
Admission: EM | Admit: 2018-05-28 | Discharge: 2018-05-28 | Disposition: A | Discharge status: Home / Self Care | Payer: Medicaid Other | Attending: Physician Assistant | Admitting: Physician Assistant

## 2018-05-28 ENCOUNTER — Ambulatory Visit (HOSPITAL_COMMUNITY)
Admission: EM | Admit: 2018-05-28 | Discharge: 2018-05-28 | Discharge status: Against Medical Advice | Payer: Medicaid Other

## 2018-05-28 ENCOUNTER — Encounter (HOSPITAL_COMMUNITY): Payer: Self-pay | Admitting: Emergency Medicine

## 2018-05-28 DIAGNOSIS — R300 Dysuria: Secondary | ICD-10-CM

## 2018-05-28 DIAGNOSIS — Z202 Contact with and (suspected) exposure to infections with a predominantly sexual mode of transmission: Secondary | ICD-10-CM

## 2018-05-28 DIAGNOSIS — M25511 Pain in right shoulder: Secondary | ICD-10-CM

## 2018-05-28 MED ORDER — CEFTRIAXONE SODIUM 250 MG IJ SOLR
INTRAMUSCULAR | Status: AC
  Filled 2018-05-28: qty 250

## 2018-05-28 MED ORDER — AZITHROMYCIN 250 MG PO TABS
ORAL_TABLET | ORAL | Status: AC
  Filled 2018-05-28: qty 4

## 2018-05-28 MED ORDER — AZITHROMYCIN 250 MG PO TABS
1000.0000 mg | ORAL_TABLET | Freq: Once | ORAL | Status: AC
  Administered 2018-05-28: 1000 mg via ORAL

## 2018-05-28 MED ORDER — CEFTRIAXONE SODIUM 250 MG IJ SOLR
250.0000 mg | Freq: Once | INTRAMUSCULAR | Status: AC
  Administered 2018-05-28: 250 mg via INTRAMUSCULAR

## 2018-05-28 MED ORDER — LIDOCAINE HCL (PF) 1 % IJ SOLN
INTRAMUSCULAR | Status: AC
  Filled 2018-05-28: qty 2

## 2018-05-28 MED ORDER — IBUPROFEN 600 MG PO TABS: 600.0000 mg | ORAL_TABLET | Freq: Four times a day (QID) | ORAL | 0 refills | Status: DC | PRN

## 2018-05-28 NOTE — ED Notes (Signed)
Patient called for triage with no response, patient not seen in lobby

## 2018-05-28 NOTE — ED Triage Notes (Signed)
Pt requesting std check. No symptoms.

## 2018-05-28 NOTE — ED Provider Notes (Signed)
05/28/2018 5:10 PM   DOB: [birth year] / MRN: [id number]  SUBJECTIVE:  Shawn [name] is a 22 y.o. male presenting for 2 problems.  Also would like a shoulder.  Tells me the shoulder has been "coming out."  Tells me he is now having pain all the time and feels that it wants to pop out more more often.  Had unprotected sex about a week ago and then another time last night.  Would like to be treated for STIs.  Tells me is been having some dysuria but denies frequency, urgency, testicular pain, hematuria, fever, chills, abdominal pain.  He would like to forego testing if possible.  He has No Known Allergies.   He  has a past medical history of ADHD (attention deficit hyperactivity disorder), Asthma, and Environmental allergies.    He  reports that he has never smoked. He has never used smokeless tobacco. He reports that he does not drink alcohol or use drugs. He  has no sexual activity history on file. The patient  has no past surgical history on file.  His family history includes Healthy in his mother.  ROS per HPI  OBJECTIVE:  BP 105/77   Pulse 72   Temp 98 F (36.7 C)   Resp 16   SpO2 99%   Wt Readings from Last 3 Encounters:  07/22/17 175 lb (79.4 kg)  03/25/16 180 lb (81.6 kg) (84 %, Z= 0.99)*  02/06/16 180 lb (81.6 kg) (84 %, Z= 1.01)*   * Growth percentiles are based on CDC (Boys, 2-20 Years) data.   Temp Readings from Last 3 Encounters:  05/28/18 98 F (36.7 C)  01/04/18 98.6 F (37 C) (Oral)  11/24/17 98 F (36.7 C) (Oral)   BP Readings from Last 3 Encounters:  05/28/18 105/77  01/04/18 123/69  11/24/17 140/73   Pulse Readings from Last 3 Encounters:  05/28/18 72  01/04/18 70  11/24/17 77    Physical Exam  Constitutional: He is oriented to person, place, and time. He appears well-developed. He does not appear ill.  Eyes: Pupils are equal, round, and reactive to light. Conjunctivae and EOM are normal.  Cardiovascular: Normal rate, regular rhythm, S1 normal, S2  normal, normal heart sounds, intact distal pulses and normal pulses. Exam reveals no gallop and no friction rub.  No murmur heard. Pulmonary/Chest: Effort normal. No stridor. No respiratory distress. He has no wheezes. He has no rales.  Abdominal: He exhibits no distension.  Musculoskeletal: Normal range of motion. He exhibits tenderness (Range of motion of shoulder guarded generally.  Positive empty can test.  Negative Neer's, negative Leanord Asal.  With the patient in the supine position and shoulder abduction at 90 degrees patient has apprehension with external rotation improved with). He exhibits no edema.  Neurological: He is alert and oriented to person, place, and time. No cranial nerve deficit. Coordination normal.  Skin: Skin is warm and dry. He is not diaphoretic.  Psychiatric: He has a normal mood and affect.  Nursing note and vitals reviewed.   No results found for this or any previous visit (from the past 72 hour(s)).  No results found.  ASSESSMENT AND PLAN:   Dysuria  Acute pain of right shoulder    Discharge Instructions     No sex for 7 days after treatment.  Please call Dr. Diamantina Providence office as listed on these papers so you can get your shoulder evaluated more thoroughly.  Take ibuprofen as needed every 8 hours.  I prescribed you some  prescription strength.        The patient is advised to call or return to clinic if he does not see an improvement in symptoms, or to seek the care of the closest emergency department if he worsens with the above plan.   Deliah BostonMichael Khalani Novoa, MHS, PA-C 05/28/2018 5:10 PM   Ofilia Neaslark, Jassmine Vandruff L, PA-C 05/28/18 1710

## 2018-05-28 NOTE — Discharge Instructions (Addendum)
No sex for 7 days after treatment.  Please call Dr. Diamantina Providenceean's office as listed on these papers so you can get your shoulder evaluated more thoroughly.  Take ibuprofen as needed every 8 hours.  I prescribed you some prescription strength.

## 2018-05-28 NOTE — ED Notes (Signed)
Called several times no answer. 

## 2018-05-28 NOTE — ED Notes (Signed)
Pt providing dirty sample of urine.

## 2018-07-27 ENCOUNTER — Encounter (HOSPITAL_COMMUNITY): Payer: Self-pay

## 2018-07-27 ENCOUNTER — Ambulatory Visit (HOSPITAL_COMMUNITY)
Admission: EM | Admit: 2018-07-27 | Discharge: 2018-07-27 | Disposition: A | Discharge status: Home / Self Care | Payer: Medicaid Other | Attending: Family Medicine | Admitting: Family Medicine

## 2018-07-27 DIAGNOSIS — Z202 Contact with and (suspected) exposure to infections with a predominantly sexual mode of transmission: Secondary | ICD-10-CM

## 2018-07-27 DIAGNOSIS — Z113 Encounter for screening for infections with a predominantly sexual mode of transmission: Secondary | ICD-10-CM | POA: Diagnosis not present

## 2018-07-27 MED ORDER — CEFTRIAXONE SODIUM 250 MG IJ SOLR
250.0000 mg | Freq: Once | INTRAMUSCULAR | Status: AC
  Administered 2018-07-27: 250 mg via INTRAMUSCULAR

## 2018-07-27 MED ORDER — LIDOCAINE HCL (PF) 1 % IJ SOLN
INTRAMUSCULAR | Status: AC
  Filled 2018-07-27: qty 2

## 2018-07-27 MED ORDER — CEFTRIAXONE SODIUM 250 MG IJ SOLR
INTRAMUSCULAR | Status: AC
  Filled 2018-07-27: qty 250

## 2018-07-27 MED ORDER — AZITHROMYCIN 250 MG PO TABS
1000.0000 mg | ORAL_TABLET | Freq: Once | ORAL | Status: AC
  Administered 2018-07-27: 1000 mg via ORAL

## 2018-07-27 MED ORDER — AZITHROMYCIN 250 MG PO TABS
ORAL_TABLET | ORAL | Status: AC
  Filled 2018-07-27: qty 4

## 2018-07-27 NOTE — ED Triage Notes (Signed)
Pt presents for STD testing and treatment.

## 2018-07-27 NOTE — ED Provider Notes (Signed)
[facility]    CSN: [id number] Arrival date & time: 07/27/18  1645     History   Chief Complaint Chief Complaint  Patient presents with  . Exposure to STD    HPI Shawn [name] is a 22 y.o. male.   HPI  Wants STD testing No known exposure No symptoms Has a GF of 3 years and they just found out she is pregnant.  He states he "just want to make sure they are both healthy.  He told her that he wanted to be tested because he "drank after someone"  He wants treatment today He declines need for HIV and RPR testing although I tell him it is indicated  Past Medical History:  Diagnosis Date  . ADHD (attention deficit hyperactivity disorder)   . Asthma   . Environmental allergies     There are no active problems to display for this patient.   History reviewed. No pertinent surgical history.     Home Medications    Prior to Admission medications   Medication Sig Start Date End Date Taking? Authorizing Provider  Diphenhydramine-APAP, sleep, (TYLENOL PM EXTRA STRENGTH PO) Take by mouth.    [provider]  ibuprofen (ADVIL,MOTRIN) 600 MG tablet Take 1 tablet (600 mg total) by mouth every 6 (six) hours as needed. 05/28/18   Ofilia Neaslark, Michael L, PA-C    Family History Family History  Problem Relation Age of Onset  . Healthy Mother     Social History Social History   Tobacco Use  . Smoking status: Never Smoker  . Smokeless tobacco: Never Used  Substance Use Topics  . Alcohol use: No  . Drug use: No     Allergies   Patient has no known allergies.   Review of Systems Review of Systems  Constitutional: Negative for chills and fever.  HENT: Negative for ear pain and sore throat.   Eyes: Negative for pain and visual disturbance.  Respiratory: Negative for cough and shortness of breath.   Cardiovascular: Negative for chest pain and palpitations.  Gastrointestinal: Negative for abdominal pain and vomiting.  Genitourinary: Negative for discharge,  dysuria, hematuria, penile pain and penile swelling.  Musculoskeletal: Negative for arthralgias and back pain.  Skin: Negative for color change and rash.  Neurological: Negative for seizures and syncope.  All other systems reviewed and are negative.    Physical Exam Triage Vital Signs ED Triage Vitals [07/27/18 1750]  Enc Vitals Group     BP (!) 124/51     Pulse Rate 70     Resp 20     Temp 98.4 F (36.9 C)     Temp Source Oral     SpO2 95 %   No data found.  Updated Vital Signs BP (!) 124/51 (BP Location: Left Arm)   Pulse 70   Temp 98.4 F (36.9 C) (Oral)   Resp 20   SpO2 95%       Physical Exam  Constitutional: He appears well-developed and well-nourished. No distress.  HENT:  Head: Normocephalic and atraumatic.  Mouth/Throat: Oropharynx is clear and moist.  Eyes: Pupils are equal, round, and reactive to light. Conjunctivae are normal.  Neck: Normal range of motion.  Cardiovascular: Normal rate.  Pulmonary/Chest: Effort normal. No respiratory distress.  Abdominal: Soft. He exhibits no distension.  Musculoskeletal: Normal range of motion. He exhibits no edema.  Neurological: He is alert.  Skin: Skin is warm and dry.     UC Treatments / Results  Labs (all  labs ordered are listed, but only abnormal results are displayed) Labs Reviewed  URINE CYTOLOGY ANCILLARY ONLY    EKG None  Radiology No results found.  Procedures Procedures (including critical care time)  Medications Ordered in UC Medications  cefTRIAXone (ROCEPHIN) injection 250 mg (has no administration in time range)  azithromycin (ZITHROMAX) tablet 1,000 mg (has no administration in time range)    Initial Impression / Assessment and Plan / UC Course  I have reviewed the triage vital signs and the nursing notes.  Pertinent labs & imaging results that were available during my care of the patient were reviewed by me and considered in my medical decision making (see chart for details).        Final Clinical Impressions(s) / UC Diagnoses   Final diagnoses:  Possible exposure to STD     Discharge Instructions     We did lab testing during this visit.  If there are any abnormal findings that require change in medicine or indicate a positive result, you will be notified.  If all of your tests are normal, you will not be called.  Avoid sexual relations and / or use a condom until all the test results are back and treated     ED Prescriptions    None     Controlled Substance Prescriptions [place] Controlled Substance Registry consulted? Not Applicable   Eustace Moore, MD 07/27/18 1821

## 2018-07-27 NOTE — Discharge Instructions (Signed)
We did lab testing during this visit.  If there are any abnormal findings that require change in medicine or indicate a positive result, you will be notified.  If all of your tests are normal, you will not be called.  Avoid sexual relations and / or use a condom until all the test results are back and treated

## 2018-07-28 LAB — URINE CYTOLOGY ANCILLARY ONLY
CHLAMYDIA, DNA PROBE: NEGATIVE
NEISSERIA GONORRHEA: NEGATIVE
Trichomonas: NEGATIVE

## 2018-12-16 ENCOUNTER — Other Ambulatory Visit: Payer: Self-pay

## 2018-12-16 ENCOUNTER — Emergency Department (HOSPITAL_COMMUNITY): Payer: Medicaid Other

## 2018-12-16 ENCOUNTER — Emergency Department (HOSPITAL_COMMUNITY)
Admission: EM | Admit: 2018-12-16 | Discharge: 2018-12-16 | Disposition: A | Discharge status: Home / Self Care | Payer: Medicaid Other | Attending: Emergency Medicine | Admitting: Emergency Medicine

## 2018-12-16 ENCOUNTER — Encounter (HOSPITAL_COMMUNITY): Payer: Self-pay

## 2018-12-16 DIAGNOSIS — J45909 Unspecified asthma, uncomplicated: Secondary | ICD-10-CM | POA: Insufficient documentation

## 2018-12-16 DIAGNOSIS — Y9389 Activity, other specified: Secondary | ICD-10-CM | POA: Diagnosis not present

## 2018-12-16 DIAGNOSIS — S99921A Unspecified injury of right foot, initial encounter: Secondary | ICD-10-CM | POA: Diagnosis present

## 2018-12-16 DIAGNOSIS — Y92410 Unspecified street and highway as the place of occurrence of the external cause: Secondary | ICD-10-CM | POA: Insufficient documentation

## 2018-12-16 DIAGNOSIS — Y999 Unspecified external cause status: Secondary | ICD-10-CM | POA: Insufficient documentation

## 2018-12-16 DIAGNOSIS — S9031XA Contusion of right foot, initial encounter: Secondary | ICD-10-CM | POA: Diagnosis not present
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
## 2018-12-16 NOTE — ED Notes (Signed)
Patient verbalizes understanding of discharge instructions. Opportunity for questioning and answers were provided. Armband removed by staff, pt discharged from ED.  
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
## 2019-01-17 ENCOUNTER — Ambulatory Visit (HOSPITAL_COMMUNITY)
Admission: EM | Admit: 2019-01-17 | Discharge: 2019-01-17 | Disposition: A | Discharge status: Home / Self Care | Payer: Medicaid Other | Attending: Internal Medicine | Admitting: Internal Medicine

## 2019-01-17 ENCOUNTER — Other Ambulatory Visit: Payer: Self-pay

## 2019-01-17 ENCOUNTER — Encounter (HOSPITAL_COMMUNITY): Payer: Self-pay | Admitting: Emergency Medicine

## 2019-01-17 DIAGNOSIS — R6889 Other general symptoms and signs: Secondary | ICD-10-CM | POA: Diagnosis not present

## 2019-01-17 MED ORDER — OSELTAMIVIR PHOSPHATE 75 MG PO CAPS: 75.0000 mg | ORAL_CAPSULE | Freq: Two times a day (BID) | ORAL | 0 refills | Status: DC

## 2019-01-17 NOTE — ED Triage Notes (Signed)
Fatigue, general aches Top lip feeling "numbish".    Symptoms started 2-3 days ago.    Runny nose, sneezing.  Denies fever

## 2019-01-22 ENCOUNTER — Encounter (HOSPITAL_COMMUNITY): Payer: Self-pay

## 2019-01-22 ENCOUNTER — Emergency Department (HOSPITAL_COMMUNITY)
Admission: EM | Admit: 2019-01-22 | Discharge: 2019-01-22 | Disposition: A | Discharge status: Home / Self Care | Payer: Medicaid Other | Attending: Emergency Medicine | Admitting: Emergency Medicine

## 2019-01-22 DIAGNOSIS — J209 Acute bronchitis, unspecified: Secondary | ICD-10-CM | POA: Insufficient documentation

## 2019-01-22 DIAGNOSIS — R0602 Shortness of breath: Secondary | ICD-10-CM | POA: Diagnosis present

## 2019-01-22 DIAGNOSIS — J45909 Unspecified asthma, uncomplicated: Secondary | ICD-10-CM | POA: Diagnosis not present

## 2019-01-22 MED ORDER — OXYMETAZOLINE HCL 0.05 % NA SOLN
2.0000 | Freq: Two times a day (BID) | NASAL | Status: DC | PRN
  Administered 2019-01-22: 2 via NASAL
  Filled 2019-01-22: qty 30

## 2019-01-22 MED ORDER — ALBUTEROL SULFATE HFA 108 (90 BASE) MCG/ACT IN AERS
2.0000 | INHALATION_SPRAY | RESPIRATORY_TRACT | Status: DC | PRN
  Administered 2019-01-22: 2 via RESPIRATORY_TRACT
  Filled 2019-01-22: qty 6.7

## 2019-01-22 NOTE — ED Provider Notes (Signed)
[facility] Provider Note: Lowella Dell, MD, FACEP  CSN: [id number] MRN: [id number] ARRIVAL: 01/22/19 at 0339 ROOM: WA17/WA17   CHIEF COMPLAINT  Asthma (dx flu)   HISTORY OF PRESENT ILLNESS  01/22/19 3:50 AM Shawn [name] is a 23 y.o. male with a history of asthma.  He was diagnosed with influenza last week.  His symptoms of included body aches, general malaise, nasal congestion, ear pressure and cough.  He has not had shortness of breath until this morning when he developed shortness of breath with wheezing and does not have an inhaler.  His shortness of breath was moderate to severe and he had to call EMS.  He was given a DuoNeb and an albuterol treatment by EMS prior to arrival with significant improvement.  He complains of persistent nasal congestion and ear pressure.    Past Medical History:  Diagnosis Date  . ADHD (attention deficit hyperactivity disorder)   . Asthma   . Environmental allergies     History reviewed. No pertinent surgical history.  Family History  Problem Relation Age of Onset  . Healthy Mother     Social History   Tobacco Use  . Smoking status: Never Smoker  . Smokeless tobacco: Never Used  Substance Use Topics  . Alcohol use: Yes  . Drug use: No    Prior to Admission medications   Medication Sig Start Date End Date Taking? Authorizing Provider  oseltamivir (TAMIFLU) 75 MG capsule Take 1 capsule (75 mg total) by mouth every 12 (twelve) hours. 01/17/19  Yes Arnaldo Natal, MD    Allergies Patient has no known allergies.   REVIEW OF SYSTEMS  Negative except as noted here or in the History of Present Illness.   PHYSICAL EXAMINATION  Initial Vital Signs Blood pressure (!) 167/94, pulse 66, temperature 98.2 F (36.8 C), temperature source Axillary, resp. rate (!) 22, height 5\' 9"  (1.753 m), weight 81.6 kg, SpO2 100 %.  Examination General: Well-developed, well-nourished male in no acute distress; appearance consistent with age of  record HENT: normocephalic; atraumatic; nasal congestion; TMs normal Eyes: pupils equal, round and reactive to light; extraocular muscles intact Neck: supple Heart: regular rate and rhythm Lungs: Faint expiratory wheezes in the bases Abdomen: soft; nondistended; nontender; bowel sounds present Extremities: No deformity; full range of motion; pulses normal Neurologic: Awake, alert and oriented; motor function intact in all extremities and symmetric; no facial droop Skin: Warm and dry Psychiatric: Normal mood and affect   RESULTS  Summary of this visit's results, reviewed by myself:   EKG Interpretation  Date/Time:    Ventricular Rate:    PR Interval:    QRS Duration:   QT Interval:    QTC Calculation:   R Axis:     Text Interpretation:        Laboratory Studies: No results found for this or any previous visit (from the past 24 hour(s)). Imaging Studies: No results found.  ED COURSE and MDM  Nursing notes and initial vitals signs, including pulse oximetry, reviewed.  Vitals:   01/22/19 0342 01/22/19 0343  BP:  (!) 167/94  Pulse:  67  Resp:  (!) 22  Temp:  98.2 F (36.8 C)  TempSrc:  Axillary  SpO2: 94% 100%  Weight:  81.6 kg  Height:  5\' 9"  (1.753 m)   Patient given albuterol inhaler with AeroChamber. Also given Afrin for nasal congestion.  PROCEDURES    ED DIAGNOSES     ICD-10-CM   1. Acute bronchitis with  bronchospasm J20.9        Danyla Wattley, Jonny Ruiz, MD 01/22/19 [phone]

## 2019-01-22 NOTE — ED Notes (Signed)
Bed: WA17 Expected date:  Expected time:  Means of arrival:  Comments: 44 M asthma

## 2019-01-22 NOTE — ED Triage Notes (Addendum)
Pt diagnoses with the flu last week, reports flu likes symptoms. Pt reports history of asthma but no medications due to lack of attack in over a year. Some wheezing noted, given one duoneb and one dose of albuterol. 125 of solumedrol given as well.

## 2019-08-22 NOTE — ED Provider Notes (Signed)
[facility]    CSN: [id number] Arrival date & time: 01/17/19  1648      History   Chief Complaint Chief Complaint  Patient presents with  . URI    HPI Shawn [name] is a 23 y.o. male.   Patient c/o vague URI symptoms including runny nose and malaise. Admits to occasional body aches x2 days     Past Medical History:  Diagnosis Date  . ADHD (attention deficit hyperactivity disorder)   . Asthma   . Environmental allergies     There are no active problems to display for this patient.   History reviewed. No pertinent surgical history.     Home Medications    Prior to Admission medications   Medication Sig Start Date End Date Taking? Authorizing Provider  oseltamivir (TAMIFLU) 75 MG capsule Take 1 capsule (75 mg total) by mouth every 12 (twelve) hours. 01/17/19   Arnaldo Natal, MD    Family History Family History  Problem Relation Age of Onset  . Healthy Mother     Social History Social History   Tobacco Use  . Smoking status: Never Smoker  . Smokeless tobacco: Never Used  Substance Use Topics  . Alcohol use: Yes  . Drug use: No     Allergies   Patient has no known allergies.   Review of Systems Review of Systems   Physical Exam Triage Vital Signs ED Triage Vitals  Enc Vitals Group     BP 01/17/19 1757 129/60     Pulse Rate 01/17/19 1757 83     Resp 01/17/19 1757 20     Temp 01/17/19 1757 98.7 F (37.1 C)     Temp Source 01/17/19 1757 Temporal     SpO2 01/17/19 1757 97 %     Weight --      Height --      Head Circumference --      Peak Flow --      Pain Score 01/17/19 1755 7     Pain Loc --      Pain Edu? --      Excl. in GC? --    No data found.  Updated Vital Signs BP 129/60 (BP Location: Right Arm) Comment (BP Location): LARGE CUFF  Pulse 83   Temp 98.7 F (37.1 C) (Temporal)   Resp 20   SpO2 97%   Visual Acuity Right Eye Distance:   Left Eye Distance:   Bilateral Distance:    Right Eye Near:   Left  Eye Near:    Bilateral Near:     Physical Exam   UC Treatments / Results  Labs (all labs ordered are listed, but only abnormal results are displayed) Labs Reviewed - No data to display  EKG   Radiology No results found.  Procedures Procedures (including critical care time)  Medications Ordered in UC Medications - No data to display  Initial Impression / Assessment and Plan / UC Course  I have reviewed the triage vital signs and the nursing notes.  Pertinent labs & imaging results that were available during my care of the patient were reviewed by me and considered in my medical decision making (see chart for details).     Flu-like symptoms within treatment window for flu.  Rx Tamiflu  Final Clinical Impressions(s) / UC Diagnoses   Final diagnoses:  Flu-like symptoms   Discharge Instructions   None    ED Prescriptions    Medication Sig Dispense Auth. Provider  oseltamivir (TAMIFLU) 75 MG capsule Take 1 capsule (75 mg total) by mouth every 12 (twelve) hours. 10 capsule Harrie Foreman, MD     PDMP not reviewed this encounter.   Harrie Foreman, MD 08/22/19 2153

## 2020-04-03 ENCOUNTER — Other Ambulatory Visit: Payer: Self-pay

## 2020-04-03 ENCOUNTER — Ambulatory Visit (INDEPENDENT_AMBULATORY_CARE_PROVIDER_SITE_OTHER): Payer: Medicaid Other

## 2020-04-03 ENCOUNTER — Ambulatory Visit (HOSPITAL_COMMUNITY)
Admission: EM | Admit: 2020-04-03 | Discharge: 2020-04-03 | Disposition: A | Discharge status: Home / Self Care | Payer: Medicaid Other | Attending: Family Medicine | Admitting: Family Medicine

## 2020-04-03 ENCOUNTER — Encounter (HOSPITAL_COMMUNITY): Payer: Self-pay

## 2020-04-03 DIAGNOSIS — M25511 Pain in right shoulder: Secondary | ICD-10-CM | POA: Diagnosis not present

## 2020-04-03 MED ORDER — PREDNISONE 10 MG (21) PO TBPK: ORAL_TABLET | ORAL | 0 refills | Status: DC

## 2020-04-03 NOTE — ED Triage Notes (Signed)
Pt states injured right shoulder boxing a few years ago and today the pain got worse. Pt c/o 6/10 sharp pain in right shoulder. Pt has decreased ROM of right shoulder. Pt c/o constipation. Pt last Bm was yesterday and said it was hard stool.

## 2020-04-03 NOTE — ED Provider Notes (Signed)
[facility]    CSN: [id number] Arrival date & time: 04/03/20  1356      History   Chief Complaint Chief Complaint  Patient presents with  . Shoulder Injury    HPI Shawn Shawn [name] is Shawn [name] 24 y.o. male.   Patient is Shawn [name] 24 year old male that presents today with right shoulder pain.  Reporting the pain has been there for Shawn [name] few days but worsened today.  He has prior injury to the shoulder Shawn [name] few years ago boxing.  The shoulder is mildly swollen.  Limited range of motion.  No specific new injuries.  No radiation of pain, numbness or tingling.  He has not take anything for his symptoms.  He works at [employer] and does Shawn [name] lot of heavy lifting daily.  ROS per HPI      Past Medical History:  Diagnosis Date  . ADHD (attention deficit hyperactivity disorder)   . Asthma   . Environmental allergies     There are no problems to display for this patient.   History reviewed. No pertinent surgical history.     Home Medications    Prior to Admission medications   Medication Sig Start Date End Date Taking? Authorizing Provider  predniSONE (STERAPRED UNI-PAK 21 TAB) 10 MG (21) TBPK tablet 6 tabs for 1 day, then 5 tabs for 1 das, then 4 tabs for 1 day, then 3 tabs for 1 day, 2 tabs for 1 day, then 1 tab for 1 day 04/03/20   Janace Aris, NP    Family History Family History  Problem Relation Age of Onset  . Healthy Mother     Social History Social History   Tobacco Use  . Smoking status: Never Smoker  . Smokeless tobacco: Never Used  Substance Use Topics  . Alcohol use: Yes    Comment: occ  . Drug use: No     Allergies   Patient has no known allergies.   Review of Systems Review of Systems   Physical Exam Triage Vital Signs ED Triage Vitals  Enc Vitals Group     BP 04/03/20 1417 114/83     Pulse Rate 04/03/20 1417 70     Resp 04/03/20 1417 16     Temp 04/03/20 1417 98.3 F (36.8 C)     Temp Source 04/03/20 1417 Oral     SpO2 04/03/20 1417 98 %     Weight  04/03/20 1422 170 lb (77.1 kg)     Height 04/03/20 1422 5\' 11"  (1.803 m)     Head Circumference --      Peak Flow --      Pain Score 04/03/20 1422 6     Pain Loc --      Pain Edu? --      Excl. in GC? --    No data found.  Updated Vital Signs BP 114/83   Pulse 70   Temp 98.3 F (36.8 C) (Oral)   Resp 16   Ht 5\' 11"  (1.803 m)   Wt 170 lb (77.1 kg)   SpO2 98%   BMI 23.71 kg/m   Visual Acuity Right Eye Distance:   Left Eye Distance:   Bilateral Distance:    Right Eye Near:   Left Eye Near:    Bilateral Near:     Physical Exam Vitals and nursing note reviewed.  Constitutional:      Appearance: Normal appearance.  HENT:     Head: Normocephalic and atraumatic.     Nose:  Nose normal.  Eyes:     Conjunctiva/sclera: Conjunctivae normal.  Pulmonary:     Effort: Pulmonary effort is normal.  Musculoskeletal:     Right shoulder: Swelling and tenderness present. Decreased range of motion.     Cervical back: Normal range of motion.     Comments: Generalized tenderness to the right shoulder.  Limited range of motion.  Skin:    General: Skin is warm and dry.  Neurological:     Mental Status: He is alert.  Psychiatric:        Mood and Affect: Mood normal.      UC Treatments / Results  Labs (all labs ordered are listed, but only abnormal results are displayed) Labs Reviewed - No data to display  EKG   Radiology DG Shoulder Right  Result Date: 04/03/2020 CLINICAL DATA:  Right shoulder pain x2 days. EXAM: RIGHT SHOULDER - 2+ VIEW COMPARISON:  July 22, 2017 FINDINGS: There is no evidence of fracture or dislocation. There is no evidence of arthropathy or other focal bone abnormality. Soft tissues are unremarkable. IMPRESSION: Negative. Electronically Signed   By: Virgina Norfolk M.D.   On: 04/03/2020 15:04    Procedures Procedures (including critical care time)  Medications Ordered in UC Medications - No data to display  Initial Impression / Assessment and  Plan / UC Course  I have reviewed the triage vital signs and the nursing notes.  Pertinent labs & imaging results that were available during my care of the patient were reviewed by me and considered in my medical decision making (see chart for details).     Shoulder pain X ray without any acute finding Most likely strain or possible rotator cuff issue.  Will treat with prednisone taper.  RICE Pt refused work note.  recommended follow up with sports medicine as needed.  Final Clinical Impressions(s) / UC Diagnoses   Final diagnoses:  Pain in joint of right shoulder     Discharge Instructions     Take the medication as prescribed.  Take with food. Rest and ice the area Follow-up with sports medicine for any continued or worsening problems    ED Prescriptions    Medication Sig Dispense Auth. Provider   predniSONE (STERAPRED UNI-PAK 21 TAB) 10 MG (21) TBPK tablet 6 tabs for 1 day, then 5 tabs for 1 das, then 4 tabs for 1 day, then 3 tabs for 1 day, 2 tabs for 1 day, then 1 tab for 1 day 21 tablet Shawn Winstanley A, NP     PDMP not reviewed this encounter.   Loura Halt A, NP 04/03/20 1547

## 2020-04-03 NOTE — Discharge Instructions (Addendum)
Take the medication as prescribed.  Take with food. Rest and ice the area Follow-up with sports medicine for any continued or worsening problems

## 2020-04-07 ENCOUNTER — Other Ambulatory Visit: Payer: Self-pay

## 2020-04-07 ENCOUNTER — Ambulatory Visit (HOSPITAL_COMMUNITY)
Admission: EM | Admit: 2020-04-07 | Discharge: 2020-04-07 | Disposition: A | Discharge status: Home / Self Care | Payer: Medicaid Other | Attending: Family Medicine | Admitting: Family Medicine

## 2020-04-07 DIAGNOSIS — M25511 Pain in right shoulder: Secondary | ICD-10-CM | POA: Diagnosis not present

## 2020-04-07 MED ORDER — IBUPROFEN 800 MG PO TABS: 800.0000 mg | ORAL_TABLET | Freq: Three times a day (TID) | ORAL | 0 refills | Status: DC | PRN

## 2020-04-07 MED ORDER — CYCLOBENZAPRINE HCL 10 MG PO TABS: 10.0000 mg | ORAL_TABLET | Freq: Two times a day (BID) | ORAL | 0 refills | Status: DC | PRN

## 2020-04-07 NOTE — ED Provider Notes (Signed)
[place]   [id number] 04/07/20 Arrival Time: 1939  YK:DXIPJ PAIN  SUBJECTIVE: History from: patient. Shawn [name] is a 24 y.o. male complains of right shoulder pain that began a couple of months ago, but worsened over the last 2 weeks. He was seen in this office last week.  Denies a precipitating event or specific injury.  Localizes the pain to the anterior aspect of the R shoulder.  Describes the pain as intermittent and achy in character. Has been treated with a prednisone taper with temporary relief. Symptoms are made worse with activity. Reports similar symptoms in the past.   Denies fever, chills, erythema, ecchymosis, effusion, weakness, numbness and tingling, saddle paresthesias, loss of bowel or bladder function.      ROS: As per HPI.  All other pertinent ROS negative.     Past Medical History:  Diagnosis Date  . ADHD (attention deficit hyperactivity disorder)   . Asthma   . Environmental allergies    No past surgical history on file. No Known Allergies No current facility-administered medications on file prior to encounter.   Current Outpatient Medications on File Prior to Encounter  Medication Sig Dispense Refill  . predniSONE (STERAPRED UNI-PAK 21 TAB) 10 MG (21) TBPK tablet 6 tabs for 1 day, then 5 tabs for 1 das, then 4 tabs for 1 day, then 3 tabs for 1 day, 2 tabs for 1 day, then 1 tab for 1 day 21 tablet 0   Social History   Socioeconomic History  . Marital status: Single    Spouse name: Not on file  . Number of children: Not on file  . Years of education: Not on file  . Highest education level: Not on file  Occupational History  . Not on file  Tobacco Use  . Smoking status: Never Smoker  . Smokeless tobacco: Never Used  Substance and Sexual Activity  . Alcohol use: Yes    Comment: occ  . Drug use: No  . Sexual activity: Not on file  Other Topics Concern  . Not on file  Social History Narrative  . Not on file   Social Determinants of  Health   Financial Resource Strain:   . Difficulty of Paying Living Expenses:   Food Insecurity:   . Worried About Charity fundraiser in the Last Year:   . Arboriculturist in the Last Year:   Transportation Needs:   . Film/video editor (Medical):   Marland Kitchen Lack of Transportation (Non-Medical):   Physical Activity:   . Days of Exercise per Week:   . Minutes of Exercise per Session:   Stress:   . Feeling of Stress :   Social Connections:   . Frequency of Communication with Friends and Family:   . Frequency of Social Gatherings with Friends and Family:   . Attends Religious Services:   . Active Member of Clubs or Organizations:   . Attends Archivist Meetings:   Marland Kitchen Marital Status:   Intimate Partner Violence:   . Fear of Current or Ex-Partner:   . Emotionally Abused:   Marland Kitchen Physically Abused:   . Sexually Abused:    Family History  Problem Relation Age of Onset  . Healthy Mother     OBJECTIVE:  There were no vitals filed for this visit.  General appearance: ALERT; in no acute distress.  Head: NCAT Lungs: Normal respiratory effort CV: radial pulses 2+ bilaterally. Cap refill < 2 seconds Musculoskeletal:  Inspection: Skin warm, dry,  clear and intact without obvious erythema, effusion, or ecchymosis.  Palpation: Nontender to palpation ROM: FROM active and passive Strength: 5/5 shld abduction, 5/5 shld adduction, 5/5 elbow flexion, 5/5 elbow extension, 5/5 grip strength, 5/5 hip flexion, 5/5 knee abduction, 5/5 knee adduction, 5/5 knee flexion, 5/5 knee extension, 5/5 dorsiflexion, 5/5 plantar flexion Stability: Anterior/ posterior drawer intact Skin: warm and dry Neurologic: Ambulates without difficulty; Sensation intact about the upper/ lower extremities Psychological: alert and cooperative; normal mood and affect  DIAGNOSTIC STUDIES:  No results found.   ASSESSMENT & PLAN:  1. Pain in joint of right shoulder       Meds ordered this encounter    Medications  . ibuprofen (ADVIL) 800 MG tablet    Sig: Take 1 tablet (800 mg total) by mouth every 8 (eight) hours as needed for moderate pain.    Dispense:  21 tablet    Refill:  0    Order Specific Question:   Supervising Provider    Answer:   Merrilee Jansky [id number]  . cyclobenzaprine (FLEXERIL) 10 MG tablet    Sig: Take 1 tablet (10 mg total) by mouth 2 (two) times daily as needed for muscle spasms.    Dispense:  20 tablet    Refill:  0    Order Specific Question:   Supervising Provider    Answer:   Merrilee Jansky [id number]   Prescribed ibuprofen Prescribed flexeril Continue conservative management of rest, ice, and gentle stretches Take naproxen as needed for pain relief (may cause abdominal discomfort, ulcers, and GI bleeds avoid taking with other NSAIDs) Take cyclobenzaprine at nighttime for symptomatic relief. Avoid driving or operating heavy machinery while using medication. Follow up with PCP if symptoms persist Return or go to the ER if you have any new or worsening symptoms (fever, chills, chest pain, abdominal pain, changes in bowel or bladder habits, pain radiating into lower legs)   Reviewed expectations re: course of current medical issues. Questions answered. Outlined signs and symptoms indicating need for more acute intervention. Patient verbalized understanding. After Visit Summary given.       Moshe Cipro, NP 04/07/20 2107

## 2020-04-07 NOTE — Discharge Instructions (Addendum)
Take the ibuprofen as prescribed.  Rest and elevate your hand.  Apply ice packs 2-3 times a day for up to 20 minutes each.   ° °Follow up with your primary care provider or an orthopedist if you symptoms continue or worsen;  Or if you develop new symptoms, such as numbness, tingling, or weakness.   ° °

## 2020-04-07 NOTE — ED Notes (Signed)
Patient came in asking for a work note because he refused one upon last visit.  Patient given work note from 05/20 visit but decided to check in so he could extend his time out of work.

## 2020-04-17 ENCOUNTER — Encounter (HOSPITAL_COMMUNITY): Payer: Self-pay | Admitting: Emergency Medicine

## 2020-04-17 ENCOUNTER — Ambulatory Visit (HOSPITAL_COMMUNITY)
Admission: EM | Admit: 2020-04-17 | Discharge: 2020-04-17 | Disposition: A | Discharge status: Home / Self Care | Payer: Medicaid Other | Attending: Urgent Care | Admitting: Urgent Care

## 2020-04-17 ENCOUNTER — Other Ambulatory Visit: Payer: Self-pay

## 2020-04-17 DIAGNOSIS — R112 Nausea with vomiting, unspecified: Secondary | ICD-10-CM

## 2020-04-17 DIAGNOSIS — K529 Noninfective gastroenteritis and colitis, unspecified: Secondary | ICD-10-CM | POA: Diagnosis not present

## 2020-04-17 DIAGNOSIS — R1084 Generalized abdominal pain: Secondary | ICD-10-CM

## 2020-04-17 MED ORDER — ONDANSETRON 8 MG PO TBDP: 8.0000 mg | ORAL_TABLET | Freq: Three times a day (TID) | ORAL | 0 refills | Status: DC | PRN

## 2020-04-17 NOTE — ED Provider Notes (Signed)
[place]   MRN: [id number] DOB: Jan 29, 1996  Subjective:   Shawn [name] is a 24 y.o. male presenting for 2-day history of acute onset stomachache, nausea with vomiting.  Patient states he is also felt a little dizzy after he vomits.  States that he has had some loose stools but also feels constipated as if something is over his anal area.  He has tried to discuss this with another provider in the past per patient but was not examined and did not pursue it.  Denies drug use, marijuana use, alcohol use.  Patient tries to eat organic foods and limits eating out.  Denies fever, sore throat, cough, chest pain, shortness of breath.  He is not interested in Covid testing.  Denies taking chronic medications.  No Known Allergies  Past Medical History:  Diagnosis Date  . ADHD (attention deficit hyperactivity disorder)   . Asthma   . Environmental allergies      History reviewed. No pertinent surgical history.  Family History  Problem Relation Age of Onset  . Healthy Mother     Social History   Tobacco Use  . Smoking status: Never Smoker  . Smokeless tobacco: Never Used  Substance Use Topics  . Alcohol use: Yes    Comment: occ  . Drug use: No    ROS   Objective:   Vitals: BP 121/67 (BP Location: Left Arm)   Pulse 88   Temp 97.6 F (36.4 C) (Oral)   Resp 14   SpO2 99%   Physical Exam Constitutional:      General: He is not in acute distress.    Appearance: Normal appearance. He is well-developed and normal weight. He is not ill-appearing, toxic-appearing or diaphoretic.  HENT:     Head: Normocephalic and atraumatic.     Right Ear: External ear normal.     Left Ear: External ear normal.     Nose: Nose normal.     Mouth/Throat:     Pharynx: Oropharynx is clear.  Eyes:     General: No scleral icterus.       Right eye: No discharge.        Left eye: No discharge.     Extraocular Movements: Extraocular movements intact.     Pupils: Pupils are equal,  round, and reactive to light.  Cardiovascular:     Rate and Rhythm: Normal rate.  Pulmonary:     Effort: Pulmonary effort is normal.  Musculoskeletal:     Cervical back: Normal range of motion.  Neurological:     Mental Status: He is alert and oriented to person, place, and time.  Psychiatric:        Mood and Affect: Mood normal.        Behavior: Behavior normal.        Thought Content: Thought content normal.        Judgment: Judgment normal.      Assessment and Plan :   PDMP not reviewed this encounter.  1. Gastroenteritis   2. Nausea and vomiting, intractability of vomiting not specified, unspecified vomiting type   3. Generalized abdominal pain     I attempted to examine patient especially regarding for his concerns about his anal area but when I approached him for the exam table refused an exam.  He was very amicable about this.  Discussed possibility of masses about the anus including hemorrhoids, warts or other worrisome lesions but without an exam could not fully evaluate him.  He verbalized understanding  was okay with this.  I offered him medication for his nausea and vomiting but he refused this in clinic.  I advised him that I sent a prescription to his pharmacy should he change his mind about this.  Patient requested note for his work, I gave him 3 days worth as he was actively vomiting when I entered the room.  Counseled on gastroenteritis. Counseled patient on potential for adverse effects with medications prescribed/recommended today, ER and return-to-clinic precautions discussed, patient verbalized understanding.    Wallis Bamberg, [place] 04/17/20 1935

## 2020-04-17 NOTE — ED Triage Notes (Signed)
Pt c/o stomach pain onset 2 days ago. Pt states he vomited at work and at home when symptoms began and woke up the next morning feeling dizzy. He has also had some constipation. Pt states he still feels nauseous.

## 2021-08-17 ENCOUNTER — Other Ambulatory Visit: Payer: Self-pay

## 2021-08-17 ENCOUNTER — Ambulatory Visit (HOSPITAL_COMMUNITY)
Admission: EM | Admit: 2021-08-17 | Discharge: 2021-08-17 | Discharge status: Against Medical Advice | Payer: Medicaid Other

## 2021-08-17 NOTE — ED Triage Notes (Signed)
PT LWBS before triage.

## 2021-08-22 ENCOUNTER — Encounter (HOSPITAL_COMMUNITY): Payer: Self-pay | Admitting: Emergency Medicine

## 2021-08-22 ENCOUNTER — Emergency Department (HOSPITAL_COMMUNITY): Payer: Medicaid Other

## 2021-08-22 ENCOUNTER — Other Ambulatory Visit: Payer: Self-pay

## 2021-08-22 ENCOUNTER — Emergency Department (HOSPITAL_COMMUNITY)
Admission: EM | Admit: 2021-08-22 | Discharge: 2021-08-22 | Disposition: A | Discharge status: Home / Self Care | Payer: Medicaid Other | Attending: Emergency Medicine | Admitting: Emergency Medicine

## 2021-08-22 DIAGNOSIS — S99912A Unspecified injury of left ankle, initial encounter: Secondary | ICD-10-CM | POA: Diagnosis present

## 2021-08-22 DIAGNOSIS — Y9371 Activity, boxing: Secondary | ICD-10-CM | POA: Diagnosis not present

## 2021-08-22 DIAGNOSIS — X509XXA Other and unspecified overexertion or strenuous movements or postures, initial encounter: Secondary | ICD-10-CM | POA: Diagnosis not present

## 2021-08-22 DIAGNOSIS — S93402A Sprain of unspecified ligament of left ankle, initial encounter: Secondary | ICD-10-CM | POA: Diagnosis not present

## 2021-08-22 DIAGNOSIS — J45909 Unspecified asthma, uncomplicated: Secondary | ICD-10-CM | POA: Diagnosis not present

## 2021-08-22 NOTE — Discharge Instructions (Addendum)
Follow up with orthopedics if not improving. Take Motrin and Tylenol as needed as directed. Weight bear as tolerated. Apply ice for 20 minutes over a thin cloth and elevate, repeat several times/day.

## 2021-08-22 NOTE — Progress Notes (Signed)
Orthopedic Tech Progress Note Patient Details:  Shawn [name] 12-Nov-1996 [id number]  Ortho Devices Type of Ortho Device: ASO, Crutches Ortho Device/Splint Location: Left ankle Ortho Device/Splint Interventions: Application   Post Interventions Patient Tolerated: Well, Ambulated well  Alysha Doolan E Penelope Fittro 08/22/2021, 7:41 AM

## 2021-08-22 NOTE — ED Triage Notes (Signed)
Patient reports left ankle pain for several days injured while practicing boxing .

## 2021-08-22 NOTE — ED Provider Notes (Signed)
[facility] EMERGENCY DEPARTMENT Provider Note   CSN: [id number] Arrival date & time: 08/22/21  0404     History Chief Complaint  Patient presents with   Ankle Pain    Shawn [name] is a 25 y.o. male.  25 year old male with complaint of left ankle injury which occurred when he rolled his ankle a few days ago after he was boxing. Pain persists as a dull aching pain, worse with bearing weight. Patient applies an OTC ankle brace with some improvement. Swelling generally improves overnight. No prior ankle injures. No other complaints or concerns.       Past Medical History:  Diagnosis Date   ADHD (attention deficit hyperactivity disorder)    Asthma    Environmental allergies     There are no problems to display for this patient.   History reviewed. No pertinent surgical history.     Family History  Problem Relation Age of Onset   Healthy Mother     Social History   Tobacco Use   Smoking status: Never   Smokeless tobacco: Never  Vaping Use   Vaping Use: Never used  Substance Use Topics   Alcohol use: Yes    Comment: occ   Drug use: No    Home Medications Prior to Admission medications   Medication Sig Start Date End Date Taking? Authorizing Provider  ibuprofen (ADVIL) 800 MG tablet Take 1 tablet (800 mg total) by mouth every 8 (eight) hours as needed for moderate pain. 04/07/20   Moshe Cipro, NP  ondansetron (ZOFRAN-ODT) 8 MG disintegrating tablet Take 1 tablet (8 mg total) by mouth every 8 (eight) hours as needed for nausea or vomiting. 04/17/20   Wallis Bamberg, PA-C    Allergies    Patient has no known allergies.  Review of Systems   Review of Systems  Constitutional:  Negative for fever.  Musculoskeletal:  Positive for arthralgias, gait problem, joint swelling and myalgias.  Skin:  Negative for color change, rash and wound.  Allergic/Immunologic: Negative for immunocompromised state.  Neurological:  Negative for weakness and  numbness.  Hematological:  Does not bruise/bleed easily.  Psychiatric/Behavioral:  Negative for self-injury.   All other systems reviewed and are negative.  Physical Exam Updated Vital Signs BP 132/67   Pulse 64   Temp 98.5 F (36.9 C) (Oral)   Resp 16   Ht 5\' 11"  (1.803 m)   Wt 85 kg   SpO2 100%   BMI 26.14 kg/m   Physical Exam Vitals and nursing note reviewed.  Constitutional:      General: He is not in acute distress.    Appearance: He is well-developed. He is not diaphoretic.  HENT:     Head: Normocephalic and atraumatic.  Cardiovascular:     Pulses: Normal pulses.  Pulmonary:     Effort: Pulmonary effort is normal.  Musculoskeletal:        General: Swelling and tenderness present. Normal range of motion.     Left ankle: Swelling present. Tenderness present. No base of 5th metatarsal or proximal fibula tenderness. Normal range of motion.     Comments: Mild generalized ankle tenderness, mild swelling, no ecchymosis. DP pulse present, sensation intact.  Skin:    General: Skin is warm and dry.     Findings: No erythema or rash.  Neurological:     Mental Status: He is alert and oriented to person, place, and time.     Sensory: No sensory deficit.  Motor: No weakness.  Psychiatric:        Behavior: Behavior normal.    ED Results / Procedures / Treatments   Labs (all labs ordered are listed, but only abnormal results are displayed) Labs Reviewed - No data to display  EKG None  Radiology DG Ankle Complete Left  Result Date: 08/22/2021 CLINICAL DATA:  25 year old male with history of ankle pain for the past several days. EXAM: LEFT ANKLE COMPLETE - 3+ VIEW; LEFT FOOT - COMPLETE 3+ VIEW COMPARISON:  No priors. FINDINGS: There is no evidence of fracture, dislocation, or joint effusion. There is no evidence of arthropathy or other focal bone abnormality. Soft tissues are unremarkable. IMPRESSION: Negative. Electronically Signed   By: Trudie Reed M.D.   On:  08/22/2021 05:52   DG Foot Complete Left  Result Date: 08/22/2021 CLINICAL DATA:  25 year old male with history of ankle pain for the past several days. EXAM: LEFT ANKLE COMPLETE - 3+ VIEW; LEFT FOOT - COMPLETE 3+ VIEW COMPARISON:  No priors. FINDINGS: There is no evidence of fracture, dislocation, or joint effusion. There is no evidence of arthropathy or other focal bone abnormality. Soft tissues are unremarkable. IMPRESSION: Negative. Electronically Signed   By: Trudie Reed M.D.   On: 08/22/2021 05:52    Procedures Procedures   Medications Ordered in ED Medications - No data to display  ED Course  I have reviewed the triage vital signs and the nursing notes.  Pertinent labs & imaging results that were available during my care of the patient were reviewed by me and considered in my medical decision making (see chart for details).  Clinical Course as of 08/22/21 4128  Sat Aug 22, 2021  270 25 year old male with complaint of left ankle injury as above. Found to have mild generalized tenderness and swelling.  XR negative for fx. Patient is using an OTC splint, requests something with better stability. Also pain with bearing weight. Given ankle aso and crutches, can WBAT. Recommend ICE. Follow up with ortho if not improving in 1 week.  [LM]    Clinical Course User Index [LM] Alden Hipp   MDM Rules/Calculators/A&P                           Final Clinical Impression(s) / ED Diagnoses Final diagnoses:  Sprain of left ankle, unspecified ligament, initial encounter    Rx / DC Orders ED Discharge Orders     None        Jeannie Fend, PA-C 08/22/21 7867    Gerhard Munch, MD 08/22/21 1606

## 2021-08-22 NOTE — ED Provider Notes (Signed)
Emergency Medicine Provider Triage Evaluation Note  Kit Mollett , a 25 y.o. male  was evaluated in triage.  Pt complains of left ankle pain he states he rolled his ankle 2 or 3 days ago while practicing boxing.  States that the pain is achy constant he states that he initially had some swelling in his left lateral ankle that improved.  States it is still achy and painful however.  Review of Systems  Positive: Ankle pain Negative: Head injury  Physical Exam  BP 135/69 (BP Location: Left Arm)   Pulse 76   Temp 98.5 F (36.9 C) (Oral)   Resp 17   Ht 5\' 11"  (1.803 m)   Wt 85 kg   SpO2 97%   BMI 26.14 kg/m  Gen:   Awake, no distress   Resp:  Normal effort  MSK:   Moves extremities without difficulty  Other:  Left lateral and left medial malleoli are tenderness to palpation.  Some left lateral ankle swelling.  Some mild fifth metatarsal tenderness to palpation.  No bony step-off or deformity.  Medical Decision Making  Medically screening exam initiated at 5:12 AM.  Appropriate orders placed.  Aycen Porreca was informed that the remainder of the evaluation will be completed by another provider, this initial triage assessment does not replace that evaluation, and the importance of remaining in the ED until their evaluation is complete.  Will obtain x-ray of foot and ankle.\ Likely sprained ankle.  Will rule out fracture   Radene Gunning, PA 08/22/21 10/22/21    [id number], MD 08/23/21 1721

## 2021-08-22 NOTE — ED Notes (Signed)
Pt awaiting ortho tech.  PA discharging pt from triage.

## 2022-01-09 ENCOUNTER — Ambulatory Visit (HOSPITAL_COMMUNITY): Payer: Medicaid Other

## 2022-01-14 ENCOUNTER — Encounter (HOSPITAL_COMMUNITY): Payer: Self-pay

## 2022-01-14 ENCOUNTER — Ambulatory Visit (HOSPITAL_COMMUNITY)
Admission: EM | Admit: 2022-01-14 | Discharge: 2022-01-14 | Disposition: A | Discharge status: Home / Self Care | Payer: Medicaid Other | Attending: Family Medicine | Admitting: Family Medicine

## 2022-01-14 ENCOUNTER — Other Ambulatory Visit: Payer: Self-pay

## 2022-01-14 DIAGNOSIS — Z711 Person with feared health complaint in whom no diagnosis is made: Secondary | ICD-10-CM | POA: Insufficient documentation

## 2022-01-14 LAB — RPR: RPR Ser Ql: NONREACTIVE

## 2022-01-14 LAB — HIV ANTIBODY (ROUTINE TESTING W REFLEX): HIV Screen 4th Generation wRfx: NONREACTIVE

## 2022-01-14 NOTE — ED Triage Notes (Signed)
Pt is here for STD testing with blood work. ?

## 2022-01-14 NOTE — Discharge Instructions (Addendum)
Staff will call you if any test is positive, so that you can be treated. ?

## 2022-01-14 NOTE — ED Provider Notes (Signed)
?[facility] ? ? ? ?CSN: [id number] ?Arrival date & time: 01/14/22  1002 ? ? ?  ? ?History   ?Chief Complaint ?Chief Complaint  ?Patient presents with  ? Exposure to STD  ? ? ?HPI ?Whitaker Shatzer is a 26 y.o. male.  ? ? ?Exposure to STD ? ?Here for STD  testing.  No penile discharge or dysuria.  No abdominal pain, vomiting, or fever. ? ?Past Medical History:  ?Diagnosis Date  ? ADHD (attention deficit hyperactivity disorder)   ? Asthma   ? Environmental allergies   ? ? ?There are no problems to display for this patient. ? ? ?History reviewed. No pertinent surgical history. ? ? ? ? ?Home Medications   ? ?Prior to Admission medications   ?Not on File  ? ? ?Family History ?Family History  ?Problem Relation Age of Onset  ? Healthy Mother   ? ? ?Social History ?Social History  ? ?Tobacco Use  ? Smoking status: Never  ? Smokeless tobacco: Never  ?Vaping Use  ? Vaping Use: Never used  ?Substance Use Topics  ? Alcohol use: Yes  ?  Comment: occ  ? Drug use: No  ? ? ? ?Allergies   ?Patient has no known allergies. ? ? ?Review of Systems ?Review of Systems ? ? ?Physical Exam ?Triage Vital Signs ?ED Triage Vitals [01/14/22 1213]  ?Enc Vitals Group  ?   BP 121/72  ?   Pulse Rate 63  ?   Resp 16  ?   Temp 98.4 ?F (36.9 ?C)  ?   Temp Source Oral  ?   SpO2 100 %  ?   Weight   ?   Height   ?   Head Circumference   ?   Peak Flow   ?   Pain Score   ?   Pain Loc   ?   Pain Edu?   ?   Excl. in GC?   ? ?No data found. ? ?Updated Vital Signs ?BP 121/72 (BP Location: Left Arm)   Pulse 63   Temp 98.4 ?F (36.9 ?C) (Oral)   Resp 16   SpO2 100%  ? ?Visual Acuity ?Right Eye Distance:   ?Left Eye Distance:   ?Bilateral Distance:   ? ?Right Eye Near:   ?Left Eye Near:    ?Bilateral Near:    ? ?Physical Exam ?Vitals reviewed.  ?Constitutional:   ?   General: He is not in acute distress. ?   Appearance: He is not toxic-appearing.  ?Genitourinary: ?   Comments: Normal genitalia.  Patient stated he could not successfully perform the self  swab, so with a chaperone I swabbed his penile urethral orifice. ?Neurological:  ?   Mental Status: He is alert.  ? ? ? ?UC Treatments / Results  ?Labs ?(all labs ordered are listed, but only abnormal results are displayed) ?Labs Reviewed  ?HIV ANTIBODY (ROUTINE TESTING W REFLEX)  ?RPR  ?CYTOLOGY, (ORAL, ANAL, URETHRAL) ANCILLARY ONLY  ? ? ?EKG ? ? ?Radiology ?No results found. ? ?Procedures ?Procedures (including critical care time) ? ?Medications Ordered in UC ?Medications - No data to display ? ?Initial Impression / Assessment and Plan / UC Course  ?I have reviewed the triage vital signs and the nursing notes. ? ?Pertinent labs & imaging results that were available during my care of the patient were reviewed by me and considered in my medical decision making (see chart for details). ? ?  ? ?We will treat any positives per  protocol. ?Final Clinical Impressions(s) / UC Diagnoses  ? ?Final diagnoses:  ?Concern about STD in male without diagnosis  ? ? ? ?Discharge Instructions   ? ?  ?Staff will call you if any test is positive, so that you can be treated. ? ? ? ? ?ED Prescriptions   ?None ?  ? ?PDMP not reviewed this encounter. ?  ?Zenia Resides, MD ?01/14/22 1225 ? ?

## 2022-01-15 LAB — CYTOLOGY, (ORAL, ANAL, URETHRAL) ANCILLARY ONLY
Chlamydia: POSITIVE — AB
Comment: NEGATIVE
Comment: NEGATIVE
Comment: NORMAL
Neisseria Gonorrhea: NEGATIVE
Trichomonas: POSITIVE — AB

## 2022-01-19 ENCOUNTER — Telehealth (HOSPITAL_COMMUNITY): Payer: Self-pay | Admitting: Emergency Medicine

## 2022-01-19 MED ORDER — DOXYCYCLINE HYCLATE 100 MG PO CAPS: 100.0000 mg | ORAL_CAPSULE | Freq: Two times a day (BID) | ORAL | 0 refills | Status: AC

## 2022-01-19 MED ORDER — METRONIDAZOLE 500 MG PO TABS: 2000.0000 mg | ORAL_TABLET | Freq: Once | ORAL | 0 refills | Status: AC

## 2022-02-19 ENCOUNTER — Ambulatory Visit (HOSPITAL_COMMUNITY)
Admission: EM | Admit: 2022-02-19 | Discharge: 2022-02-19 | Disposition: A | Discharge status: Home / Self Care | Payer: Medicaid Other | Attending: Nurse Practitioner | Admitting: Nurse Practitioner

## 2022-02-19 ENCOUNTER — Encounter (HOSPITAL_COMMUNITY): Payer: Self-pay

## 2022-02-19 DIAGNOSIS — N50812 Left testicular pain: Secondary | ICD-10-CM | POA: Insufficient documentation

## 2022-02-19 DIAGNOSIS — Z113 Encounter for screening for infections with a predominantly sexual mode of transmission: Secondary | ICD-10-CM | POA: Insufficient documentation

## 2022-02-19 LAB — HIV ANTIBODY (ROUTINE TESTING W REFLEX): HIV Screen 4th Generation wRfx: NONREACTIVE

## 2022-02-19 MED ORDER — CEFTRIAXONE SODIUM 500 MG IJ SOLR
INTRAMUSCULAR | Status: AC
  Filled 2022-02-19: qty 500

## 2022-02-19 MED ORDER — CEFTRIAXONE SODIUM 500 MG IJ SOLR
500.0000 mg | Freq: Once | INTRAMUSCULAR | Status: AC
  Administered 2022-02-19: 500 mg via INTRAMUSCULAR

## 2022-02-19 MED ORDER — LIDOCAINE HCL (PF) 1 % IJ SOLN
INTRAMUSCULAR | Status: AC
  Filled 2022-02-19: qty 2

## 2022-02-19 NOTE — ED Provider Notes (Signed)
?[facility] ? ? ? ?CSN: [id number] ?Arrival date & time: 02/19/22  8315 ? ? ?  ? ?History   ?Chief Complaint ?Chief Complaint  ?Patient presents with  ? SEXUALLY TRANSMITTED DISEASE  ? ? ?HPI ?Shawn [name] is a 26 y.o. male.  ? ?Patient presents for STI testing today.  He reports recent sexual intercourse without use of condoms.  He denies any penile discharge, penile rashes, lesions, sores, swelling in his groin.  He denies fevers and vomiting.  He does report he is a little bit nauseous.  He is also having left testicle pain that started 2 days ago.  He is requesting treatment today because he is having testicle pain.  ? ?He tested positive about 1 month ago for trichomonas and chlamydia.   ? ? ?Past Medical History:  ?Diagnosis Date  ? ADHD (attention deficit hyperactivity disorder)   ? Asthma   ? Environmental allergies   ? ? ?There are no problems to display for this patient. ? ? ?History reviewed. No pertinent surgical history. ? ? ? ? ?Home Medications   ? ?Prior to Admission medications   ?Not on File  ? ? ?Family History ?Family History  ?Problem Relation Age of Onset  ? Healthy Mother   ? ? ?Social History ?Social History  ? ?Tobacco Use  ? Smoking status: Never  ? Smokeless tobacco: Never  ?Vaping Use  ? Vaping Use: Never used  ?Substance Use Topics  ? Alcohol use: Yes  ?  Comment: occ  ? Drug use: No  ? ? ? ?Allergies   ?Patient has no known allergies. ? ? ?Review of Systems ?Review of Systems ?Per HPI ? ?Physical Exam ?Triage Vital Signs ?ED Triage Vitals  ?Enc Vitals Group  ?   BP 02/19/22 1100 (!) 151/82  ?   Pulse Rate 02/19/22 1100 71  ?   Resp 02/19/22 1100 18  ?   Temp 02/19/22 1100 98.1 ?F (36.7 ?C)  ?   Temp Source 02/19/22 1100 Oral  ?   SpO2 02/19/22 1100 100 %  ?   Weight --   ?   Height --   ?   Head Circumference --   ?   Peak Flow --   ?   Pain Score 02/19/22 1059 2  ?   Pain Loc --   ?   Pain Edu? --   ?   Excl. in GC? --   ? ?No data found. ? ?Updated Vital Signs ?BP (!) 151/82  (BP Location: Left Arm)   Pulse 71   Temp 98.1 ?F (36.7 ?C) (Oral)   Resp 18   SpO2 100%  ? ?Visual Acuity ?Right Eye Distance:   ?Left Eye Distance:   ?Bilateral Distance:   ? ?Right Eye Near:   ?Left Eye Near:    ?Bilateral Near:    ? ?Physical Exam ?Vitals and nursing note reviewed. Exam conducted with a chaperone present.  ?Constitutional:   ?   General: He is not in acute distress. ?   Appearance: Normal appearance. He is not toxic-appearing.  ?Pulmonary:  ?   Effort: Pulmonary effort is normal. No respiratory distress.  ?Genitourinary: ?   Pubic Area: No rash.   ?   Penis: Normal. No tenderness or discharge.   ?   Testes: Normal.     ?   Right: Mass, tenderness or swelling not present.     ?   Left: Mass, tenderness or swelling not present.  ?  Epididymis:  ?   Right: Normal. Not inflamed or enlarged. No mass.  ?   Left: Normal. Not inflamed or enlarged. No mass.  ?   Comments: Rolin Barry, RMA ?Lymphadenopathy:  ?   Lower Body: No right inguinal adenopathy. No left inguinal adenopathy.  ?Skin: ?   General: Skin is warm and dry.  ?   Coloration: Skin is not jaundiced or pale.  ?   Findings: No erythema.  ?Neurological:  ?   Mental Status: He is alert and oriented to person, place, and time.  ?Psychiatric:     ?   Mood and Affect: Mood normal.     ?   Behavior: Behavior normal.  ? ? ? ?UC Treatments / Results  ?Labs ?(all labs ordered are listed, but only abnormal results are displayed) ?Labs Reviewed  ?HIV ANTIBODY (ROUTINE TESTING W REFLEX)  ?RPR  ?CYTOLOGY, (ORAL, ANAL, URETHRAL) ANCILLARY ONLY  ? ? ?EKG ? ? ?Radiology ?No results found. ? ?Procedures ?Procedures (including critical care time) ? ?Medications Ordered in UC ?Medications  ?cefTRIAXone (ROCEPHIN) injection 500 mg (has no administration in time range)  ? ? ?Initial Impression / Assessment and Plan / UC Course  ?I have reviewed the triage vital signs and the nursing notes. ? ?Pertinent labs & imaging results that were available during my  care of the patient were reviewed by me and considered in my medical decision making (see chart for details). ? ?  ?Check self-swab today for trichomonas, gonorrhea, and chlamydia.  Treat empirically given symptoms with ceftriaxone 500 mg IM.  Treat for other positives as indicated.  Also check HIV and syphilis today.  Examination today is unremarkable; no identifiable cause of testicle pain.  Follow up if this does not improve with treatment.  ? ?Final Clinical Impressions(s) / UC Diagnoses  ? ?Final diagnoses:  ?Routine screening for STI (sexually transmitted infection)  ?Pain in left testicle  ? ? ? ?Discharge Instructions   ? ?  ?- We have given you a shot of ceftriaxone today ?- We will call you if any of the test results from today are positive ?- Please seek care if your testicle pain does not improve with treatment ? ? ? ? ?ED Prescriptions   ?None ?  ? ?PDMP not reviewed this encounter. ?  ?Valentino Nose, NP ?02/19/22 1123 ? ?

## 2022-02-19 NOTE — Discharge Instructions (Addendum)
-   We have given you a shot of ceftriaxone today ?- We will call you if any of the test results from today are positive ?- Please seek care if your testicle pain does not improve with treatment ?

## 2022-02-19 NOTE — ED Triage Notes (Signed)
Pt presents for STD testing.  ? ?Pt states he has sharp pain on his L testicle.  ?

## 2022-02-20 LAB — RPR: RPR Ser Ql: NONREACTIVE

## 2022-02-22 LAB — CYTOLOGY, (ORAL, ANAL, URETHRAL) ANCILLARY ONLY
Chlamydia: NEGATIVE
Comment: NEGATIVE
Comment: NEGATIVE
Comment: NORMAL
Neisseria Gonorrhea: NEGATIVE
Trichomonas: POSITIVE — AB

## 2022-02-23 ENCOUNTER — Telehealth (HOSPITAL_COMMUNITY): Payer: Self-pay | Admitting: Emergency Medicine

## 2022-02-23 MED ORDER — METRONIDAZOLE 500 MG PO TABS: 2000.0000 mg | ORAL_TABLET | Freq: Once | ORAL | 0 refills | Status: AC

## 2022-04-17 ENCOUNTER — Encounter (HOSPITAL_COMMUNITY): Payer: Self-pay | Admitting: Emergency Medicine

## 2022-04-17 ENCOUNTER — Other Ambulatory Visit: Payer: Self-pay

## 2022-04-17 ENCOUNTER — Emergency Department (HOSPITAL_COMMUNITY)
Admission: EM | Admit: 2022-04-17 | Discharge: 2022-04-17 | Disposition: A | Discharge status: Home / Self Care | Payer: Medicaid Other | Attending: Emergency Medicine | Admitting: Emergency Medicine

## 2022-04-17 DIAGNOSIS — Z5321 Procedure and treatment not carried out due to patient leaving prior to being seen by health care provider: Secondary | ICD-10-CM | POA: Diagnosis not present

## 2022-04-17 DIAGNOSIS — N50812 Left testicular pain: Secondary | ICD-10-CM | POA: Insufficient documentation

## 2022-04-17 NOTE — ED Notes (Signed)
Called pt for room, no response. Pt not seen in lobby

## 2022-04-17 NOTE — ED Triage Notes (Signed)
L testicle pain x 1 week.  Denies swelling and urinary complaints.

## 2022-05-05 ENCOUNTER — Ambulatory Visit (HOSPITAL_COMMUNITY)
Admission: RE | Admit: 2022-05-05 | Discharge: 2022-05-05 | Disposition: A | Discharge status: Home / Self Care | Payer: Medicaid Other | Source: Ambulatory Visit | Attending: Family Medicine | Admitting: Family Medicine

## 2022-05-05 ENCOUNTER — Ambulatory Visit (HOSPITAL_COMMUNITY): Payer: Medicaid Other

## 2022-05-05 VITALS — BP 143/102 | HR 68 | Temp 98.4°F | Resp 13

## 2022-05-05 DIAGNOSIS — Z202 Contact with and (suspected) exposure to infections with a predominantly sexual mode of transmission: Secondary | ICD-10-CM

## 2022-05-05 DIAGNOSIS — Z113 Encounter for screening for infections with a predominantly sexual mode of transmission: Secondary | ICD-10-CM | POA: Insufficient documentation

## 2022-05-05 NOTE — ED Triage Notes (Signed)
Pt would like STD testing with blood work. He is concern about HSV and would like to be tested.

## 2022-05-05 NOTE — ED Provider Notes (Signed)
  [facility]   [id number] 05/05/22 Arrival Time: 1449  ASSESSMENT & PLAN:  1. Screening for STDs (sexually transmitted diseases)    No empiric treatment.    Discharge Instructions      We have sent testing for sexually transmitted infections. We will notify you of any positive results once they are received. If required, we will prescribe any medications you might need.  Please refrain from all sexual activity for at least the next seven days.     Pending: Labs Reviewed  RPR  HIV ANTIBODY (ROUTINE TESTING W REFLEX)  CYTOLOGY, (ORAL, ANAL, URETHRAL) ANCILLARY ONLY    Will notify of any positive results. Instructed to refrain from sexual activity for at least seven days.  Reviewed expectations re: course of current medical issues. Questions answered. Outlined signs and symptoms indicating need for more acute intervention. Patient verbalized understanding. After Visit Summary given.   SUBJECTIVE:  Shawn [name] is a 26 y.o. male who requests STD screening. No penile discharge. Denies: urinary frequency, dysuria, and gross hematuria. Afebrile. No abdominal or pelvic pain. No n/v. No rashes or lesions. Reports that he is sexually active with single male partner.  OBJECTIVE:  Vitals:   05/05/22 1520  BP: (!) 143/102  Pulse: 68  Resp: 13  Temp: 98.4 F (36.9 C)  TempSrc: Oral  SpO2: 100%    General appearance: alert, cooperative, appears stated age and no distress Lungs: unlabored respirations; speaks full sentences without difficulty GU: normal appearing genitalia Skin: warm and dry Psychological: alert and cooperative; normal mood and affect.    Labs Reviewed  RPR  HIV ANTIBODY (ROUTINE TESTING W REFLEX)  CYTOLOGY, (ORAL, ANAL, URETHRAL) ANCILLARY ONLY    No Known Allergies  Past Medical History:  Diagnosis Date   ADHD (attention deficit hyperactivity disorder)    Asthma    Environmental allergies    Family History  Problem Relation  Age of Onset   Healthy Mother    Social History   Socioeconomic History   Marital status: Single    Spouse name: Not on file   Number of children: Not on file   Years of education: Not on file   Highest education level: Not on file  Occupational History   Not on file  Tobacco Use   Smoking status: Never   Smokeless tobacco: Never  Vaping Use   Vaping Use: Never used  Substance and Sexual Activity   Alcohol use: Yes    Comment: occ   Drug use: No   Sexual activity: Not on file  Other Topics Concern   Not on file  Social History Narrative   Not on file   Social Determinants of Health   Financial Resource Strain: Not on file  Food Insecurity: Not on file  Transportation Needs: Not on file  Physical Activity: Not on file  Stress: Not on file  Social Connections: Not on file  Intimate Partner Violence: Not on file           Mardella Layman, MD 05/05/22 1627

## 2022-05-05 NOTE — Discharge Instructions (Signed)
We have sent testing for sexually transmitted infections. We will notify you of any positive results once they are received. If required, we will prescribe any medications you might need.  Please refrain from all sexual activity for at least the next seven days.  

## 2022-05-06 LAB — CYTOLOGY, (ORAL, ANAL, URETHRAL) ANCILLARY ONLY
Chlamydia: NEGATIVE
Comment: NEGATIVE
Comment: NEGATIVE
Comment: NORMAL
Neisseria Gonorrhea: NEGATIVE
Trichomonas: NEGATIVE

## 2022-05-06 LAB — HIV ANTIBODY (ROUTINE TESTING W REFLEX): HIV Screen 4th Generation wRfx: NONREACTIVE

## 2022-05-06 LAB — RPR: RPR Ser Ql: NONREACTIVE

## 2023-02-02 ENCOUNTER — Ambulatory Visit (HOSPITAL_COMMUNITY)
Admission: EM | Admit: 2023-02-02 | Discharge: 2023-02-02 | Disposition: A | Discharge status: Home / Self Care | Payer: Medicaid Other | Attending: Emergency Medicine | Admitting: Emergency Medicine

## 2023-02-02 ENCOUNTER — Encounter (HOSPITAL_COMMUNITY): Payer: Self-pay

## 2023-02-02 DIAGNOSIS — R1032 Left lower quadrant pain: Secondary | ICD-10-CM | POA: Diagnosis not present

## 2023-02-02 MED ORDER — METRONIDAZOLE 500 MG PO TABS: 2000.0000 mg | ORAL_TABLET | Freq: Once | ORAL | 0 refills | Status: AC

## 2023-02-02 NOTE — Discharge Instructions (Signed)
Due to your history you are being treated prophylactically for trichomoniasis  Take all 4 metronidazole tablets at 1 time and then please refrain from having sex for 7 days   Labs pending 2-3 days, you will be contacted if positive for any sti and treatment will be sent to the pharmacy, you will have to return to the clinic if positive for gonorrhea to receive treatment   Please refrain from having sex until labs results, if positive please refrain from having sex until treatment complete and symptoms resolve   If positive for Chlamydia  gonorrhea or trichomoniasis please notify partner or partners so they may tested as well  Moving forward, it is recommended you use some form of protection against the transmission of sti infections  such as condoms or dental dams with each sexual encounter    If all testing today is negative and you continue to have symptoms you may follow-up with urology for further evaluation and management as needed

## 2023-02-02 NOTE — ED Triage Notes (Signed)
Routine STD testing. Slight sharp left side hip/groin pain for 1-2 weeks.

## 2023-02-02 NOTE — ED Provider Notes (Signed)
[place]    CSN: [id number] Arrival date & time: 02/02/23  1709      History   Chief Complaint Chief Complaint  Patient presents with   SEXUALLY TRANSMITTED DISEASE    HPI Lee Kuang is a 27 y.o. male.   Patient presents for evaluation of intermittent left sharp groin pain and intermittent tingling with urination beginning 2 weeks ago.  Has not attempted treatment.  Sexually active, 1 partner, no known exposure.  Endorses 1 occurrence of a unprotected sexual encounter.  Denies frequency, urgency, hematuria, penile discharge, penile or testicle swelling, new rash or lesions.   Past Medical History:  Diagnosis Date   ADHD (attention deficit hyperactivity disorder)    Asthma    Environmental allergies     There are no problems to display for this patient.   History reviewed. No pertinent surgical history.     Home Medications    Prior to Admission medications   Not on File    Family History Family History  Problem Relation Age of Onset   Healthy Mother     Social History Social History   Tobacco Use   Smoking status: Never   Smokeless tobacco: Never  Vaping Use   Vaping Use: Never used  Substance Use Topics   Alcohol use: Yes    Comment: occ   Drug use: No     Allergies   Patient has no known allergies.   Review of Systems Review of Systems Defer to HPI    Physical Exam Triage Vital Signs ED Triage Vitals  Enc Vitals Group     BP 02/02/23 1808 120/77     Pulse Rate 02/02/23 1808 61     Resp 02/02/23 1808 16     Temp 02/02/23 1808 98.1 F (36.7 C)     Temp Source 02/02/23 1808 Oral     SpO2 02/02/23 1808 97 %     Weight 02/02/23 1807 170 lb (77.1 kg)     Height 02/02/23 1807 5\' 11"  (1.803 m)     Head Circumference --      Peak Flow --      Pain Score 02/02/23 1807 6     Pain Loc --      Pain Edu? --      Excl. in [place]? --    No data found.  Updated Vital Signs BP 120/77 (BP Location: Left Arm)   Pulse 61   Temp  98.1 F (36.7 C) (Oral)   Resp 16   Ht 5\' 11"  (1.803 m)   Wt 170 lb (77.1 kg)   SpO2 97%   BMI 23.71 kg/m   Visual Acuity Right Eye Distance:   Left Eye Distance:   Bilateral Distance:    Right Eye Near:   Left Eye Near:    Bilateral Near:     Physical Exam Constitutional:      Appearance: Normal appearance.  Eyes:     Extraocular Movements: Extraocular movements intact.  Pulmonary:     Effort: Pulmonary effort is normal.  Genitourinary:    Comments: deferred Neurological:     Mental Status: He is alert and oriented to person, place, and time. Mental status is at baseline.      UC Treatments / Results  Labs (all labs ordered are listed, but only abnormal results are displayed) Labs Reviewed  CYTOLOGY, (ORAL, ANAL, URETHRAL) ANCILLARY ONLY    EKG   Radiology No results found.  Procedures Procedures (including critical care time)  Medications Ordered in UC Medications - No data to display  Initial Impression / Assessment and Plan / UC Course  I have reviewed the triage vital signs and the nursing notes.  Pertinent labs & imaging results that were available during my care of the patient were reviewed by me and considered in my medical decision making (see chart for details).  Left groin pain  Treating prophylactically for trichomoniasis with 2 g of metronidazole as patient has had reoccurring infections, STI labs pending will treat per protocol, advised abstinence until lab results, and/or treatment is complete, advised condom use during all sexual encounters moving, all testing today is negative and symptoms continue to persist recommended follow-up with urology, referral given Final Clinical Impressions(s) / UC Diagnoses   Final diagnoses:  None   Discharge Instructions   None    ED Prescriptions   None    PDMP not reviewed this encounter.   Hans Eden, NP 02/02/23 [phone]

## 2023-02-03 LAB — CYTOLOGY, (ORAL, ANAL, URETHRAL) ANCILLARY ONLY
Chlamydia: NEGATIVE
Comment: NEGATIVE
Comment: NEGATIVE
Comment: NORMAL
Neisseria Gonorrhea: NEGATIVE
Trichomonas: NEGATIVE

## 2023-02-28 ENCOUNTER — Ambulatory Visit (HOSPITAL_COMMUNITY)
Admission: EM | Admit: 2023-02-28 | Discharge: 2023-02-28 | Disposition: A | Discharge status: Home / Self Care | Payer: Medicaid Other | Attending: Internal Medicine | Admitting: Internal Medicine

## 2023-02-28 ENCOUNTER — Encounter (HOSPITAL_COMMUNITY): Payer: Self-pay

## 2023-02-28 DIAGNOSIS — R300 Dysuria: Secondary | ICD-10-CM | POA: Insufficient documentation

## 2023-02-28 DIAGNOSIS — R369 Urethral discharge, unspecified: Secondary | ICD-10-CM | POA: Insufficient documentation

## 2023-02-28 DIAGNOSIS — R360 Urethral discharge without blood: Secondary | ICD-10-CM

## 2023-02-28 LAB — POCT URINALYSIS DIPSTICK, ED / UC
Bilirubin Urine: NEGATIVE
Glucose, UA: NEGATIVE mg/dL
Ketones, ur: NEGATIVE mg/dL
Nitrite: NEGATIVE
Protein, ur: NEGATIVE mg/dL
Specific Gravity, Urine: 1.025 (ref 1.005–1.030)
Urobilinogen, UA: 1 mg/dL (ref 0.0–1.0)
pH: 6.5 (ref 5.0–8.0)

## 2023-02-28 LAB — HIV ANTIBODY (ROUTINE TESTING W REFLEX): HIV Screen 4th Generation wRfx: NONREACTIVE

## 2023-02-28 MED ORDER — DOXYCYCLINE HYCLATE 100 MG PO CAPS: 100.0000 mg | ORAL_CAPSULE | Freq: Two times a day (BID) | ORAL | 0 refills | Status: AC

## 2023-02-28 MED ORDER — CEFTRIAXONE SODIUM 500 MG IJ SOLR
INTRAMUSCULAR | Status: AC
  Filled 2023-02-28: qty 500

## 2023-02-28 MED ORDER — CEFTRIAXONE SODIUM 500 MG IJ SOLR
500.0000 mg | INTRAMUSCULAR | Status: DC
  Administered 2023-02-28: 500 mg via INTRAMUSCULAR

## 2023-02-28 NOTE — ED Triage Notes (Signed)
STD-testing today. Reports dysuria and penile discharge that started today.

## 2023-02-28 NOTE — Discharge Instructions (Signed)
Your swab and blood work was sent to the lab for further testing.  You will be called with results. Doxycycline is an antibiotic given to treat Chlamydia. Take the prescription as directed.  You should avoid all sexual activity until you have been notified of all your results and have undergone any necessary treatment.  If you are positive, it is recommended that you inform all sexual partners so they can treat be treated as well before having sex again.

## 2023-02-28 NOTE — ED Provider Notes (Signed)
[facility]   Note:  This document was prepared using Dragon voice recognition software and may include unintentional dictation errors.  MRN: [id number] DOB: 08-Sep-1996 DATE: 02/28/23   Subjective:  Chief Complaint:  Chief Complaint  Patient presents with   Exposure to STD     HPI: Shawn [name] is a 27 y.o. male presenting for dysuria and penile discharge for 1 day.  Patient states he has had a new sexual partner within the past couple weeks and they had unprotected sex.  Reports some penile irritation a few days prior to the onset of discharge.  Per chart, patient has a history of chlamydia and trichomoniasis.  Denies fever, nausea/vomiting, abdominal pain, penile lesions. Endorses dysuria and penile discharge. Presents NAD.  Prior to Admission medications   Not on File     No Known Allergies  History:   Past Medical History:  Diagnosis Date   ADHD (attention deficit hyperactivity disorder)    Asthma    Environmental allergies      History reviewed. No pertinent surgical history.  Family History  Problem Relation Age of Onset   Healthy Mother     Social History   Tobacco Use   Smoking status: Never   Smokeless tobacco: Never  Vaping Use   Vaping Use: Never used  Substance Use Topics   Alcohol use: Yes    Comment: occ   Drug use: No    Review of Systems  Constitutional:  Negative for fever.  Gastrointestinal:  Negative for abdominal pain, nausea and vomiting.  Genitourinary:  Positive for dysuria and penile discharge. Negative for genital sores.     Objective:   Vitals: BP 120/63 (BP Location: Left Arm)   Pulse 78   Temp 97.9 F (36.6 C) (Oral)   Resp 18   SpO2 98%   Physical Exam Constitutional:      General: He is not in acute distress.    Appearance: Normal appearance. He is well-developed and normal weight. He is not ill-appearing or toxic-appearing.  HENT:     Head: Normocephalic and atraumatic.  Cardiovascular:     Rate and  Rhythm: Normal rate and regular rhythm.     Heart sounds: Normal heart sounds.  Pulmonary:     Effort: Pulmonary effort is normal.     Breath sounds: Normal breath sounds.     Comments: Clear to auscultation bilaterally  Abdominal:     General: Bowel sounds are normal.     Palpations: Abdomen is soft.     Tenderness: There is no abdominal tenderness. There is no right CVA tenderness or left CVA tenderness.  Skin:    General: Skin is warm and dry.  Neurological:     General: No focal deficit present.     Mental Status: He is alert.  Psychiatric:        Mood and Affect: Mood and affect normal.     Results:  Labs: Results for orders placed or performed during the hospital encounter of 02/28/23 (from the past 24 hour(s))  POC Urinalysis dipstick     Status: Abnormal   Collection Time: 02/28/23  2:33 PM  Result Value Ref Range   Glucose, UA NEGATIVE NEGATIVE mg/dL   Bilirubin Urine NEGATIVE NEGATIVE   Ketones, ur NEGATIVE NEGATIVE mg/dL   Specific Gravity, Urine 1.025 1.005 - 1.030   Hgb urine dipstick TRACE (A) NEGATIVE   pH 6.5 5.0 - 8.0   Protein, ur NEGATIVE NEGATIVE mg/dL   Urobilinogen, UA 1.0 0.0 -  1.0 mg/dL   Nitrite NEGATIVE NEGATIVE   Leukocytes,Ua MODERATE (A) NEGATIVE    Radiology: No results found.   UC Course/Treatments:  Procedures: Procedures   Medications Ordered in UC: Medications  cefTRIAXone (ROCEPHIN) injection 500 mg (500 mg Intramuscular Given 02/28/23 1453)     Assessment and Plan :     ICD-10-CM   1. Penile discharge  R36.9     2. Dysuria  R30.0      Penile discharge: Afebrile, nontoxic-appearing, NAD. VSS. DDX includes but not limited to: Gonorrhea, chlamydia, trichomoniasis UA was positive for leukocytes and trace amount of hemoglobin today in office.  Urine culture and cytology are pending.  Given penile discharge, recommend treatment at this time.  Doxycycline 100 mg twice daily was prescribed to empirically treat chlamydia.   Rocephin 500 mg IM was given today in office to empirically treat gonorrhea.  Safe sex precautions advised.  Strict ED precautions were given and patient verbalized understanding.  Dysuria: Afebrile, nontoxic-appearing, NAD. VSS. DDX includes but not limited to: Gonorrhea, chlamydia, trichomoniasis, UTI, pyelonephritis UA was positive for leukocytes and trace amount of hemoglobin today in office.  Urine culture and cytology are pending.  Given penile discharge, recommend treatment at this time.  Doxycycline 100 mg twice daily was prescribed to empirically treat chlamydia.  Rocephin 500 mg IM was given today in office to empirically treat gonorrhea.  Safe sex precautions advised.  Strict ED precautions were given and patient verbalized understanding.   ED Discharge Orders          Ordered    doxycycline (VIBRAMYCIN) 100 MG capsule  2 times daily        02/28/23 1444             PDMP not reviewed this encounter.     Cynda Acres, PA-C 02/28/23 1456

## 2023-03-01 LAB — URINE CULTURE: Culture: NO GROWTH

## 2023-03-01 LAB — CYTOLOGY, (ORAL, ANAL, URETHRAL) ANCILLARY ONLY
Chlamydia: NEGATIVE
Comment: NEGATIVE
Comment: NEGATIVE
Comment: NORMAL
Neisseria Gonorrhea: POSITIVE — AB
Trichomonas: NEGATIVE

## 2023-03-01 LAB — RPR: RPR Ser Ql: NONREACTIVE

## 2023-05-04 ENCOUNTER — Ambulatory Visit (HOSPITAL_COMMUNITY)
Admission: EM | Admit: 2023-05-04 | Discharge: 2023-05-04 | Disposition: A | Discharge status: Home / Self Care | Payer: Medicaid Other | Attending: Emergency Medicine | Admitting: Emergency Medicine

## 2023-05-04 ENCOUNTER — Encounter (HOSPITAL_COMMUNITY): Payer: Self-pay | Admitting: *Deleted

## 2023-05-04 DIAGNOSIS — Z113 Encounter for screening for infections with a predominantly sexual mode of transmission: Secondary | ICD-10-CM | POA: Diagnosis not present

## 2023-05-04 DIAGNOSIS — R300 Dysuria: Secondary | ICD-10-CM | POA: Insufficient documentation

## 2023-05-04 LAB — POCT URINALYSIS DIP (MANUAL ENTRY)
Bilirubin, UA: NEGATIVE
Blood, UA: NEGATIVE
Glucose, UA: NEGATIVE mg/dL
Ketones, POC UA: NEGATIVE mg/dL
Leukocytes, UA: NEGATIVE
Nitrite, UA: NEGATIVE
Protein Ur, POC: NEGATIVE mg/dL
Spec Grav, UA: 1.02 (ref 1.010–1.025)
Urobilinogen, UA: 1 E.U./dL
pH, UA: 7 (ref 5.0–8.0)

## 2023-05-04 LAB — HIV ANTIBODY (ROUTINE TESTING W REFLEX): HIV Screen 4th Generation wRfx: NONREACTIVE

## 2023-05-04 IMAGING — CR DG ANKLE COMPLETE 3+V*L*
3 series · 3 of 3 positions shown · non-contrast
Comparison: No priors.

CLINICAL DATA: 25-year-old male with history of ankle pain for the
past several days.

EXAM:
LEFT ANKLE COMPLETE - 3+ VIEW; LEFT FOOT - COMPLETE 3+ VIEW

[ankle ap]
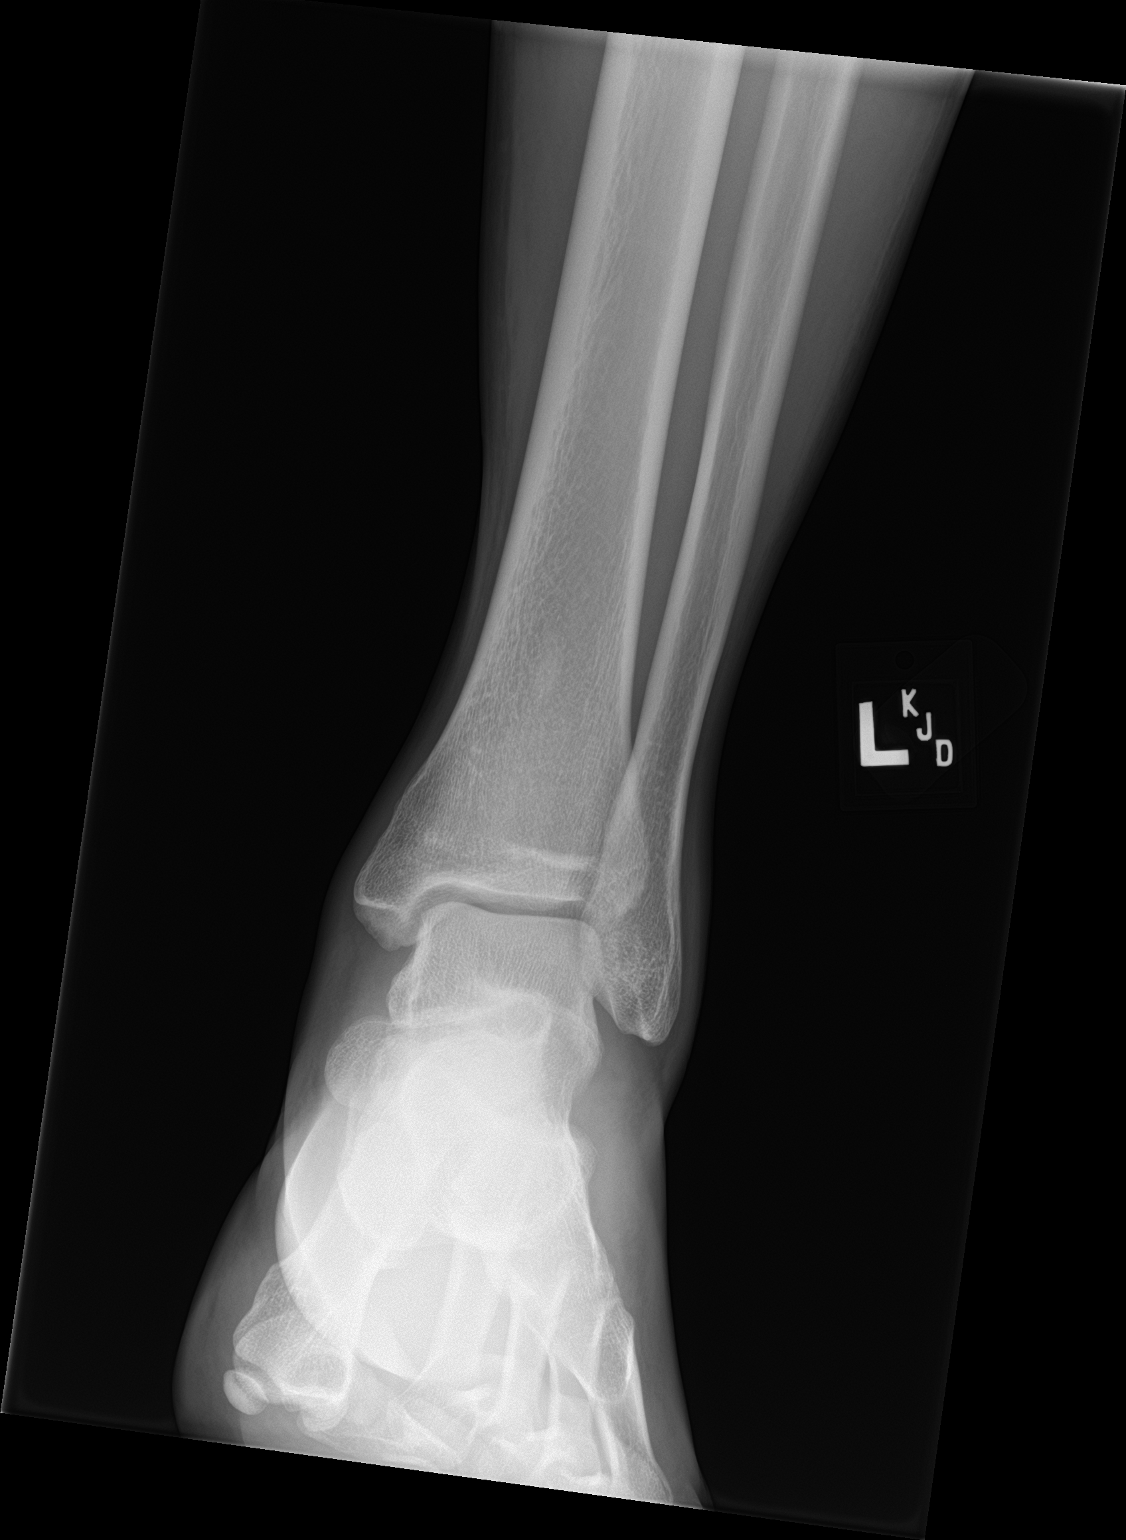

[ankle obl]
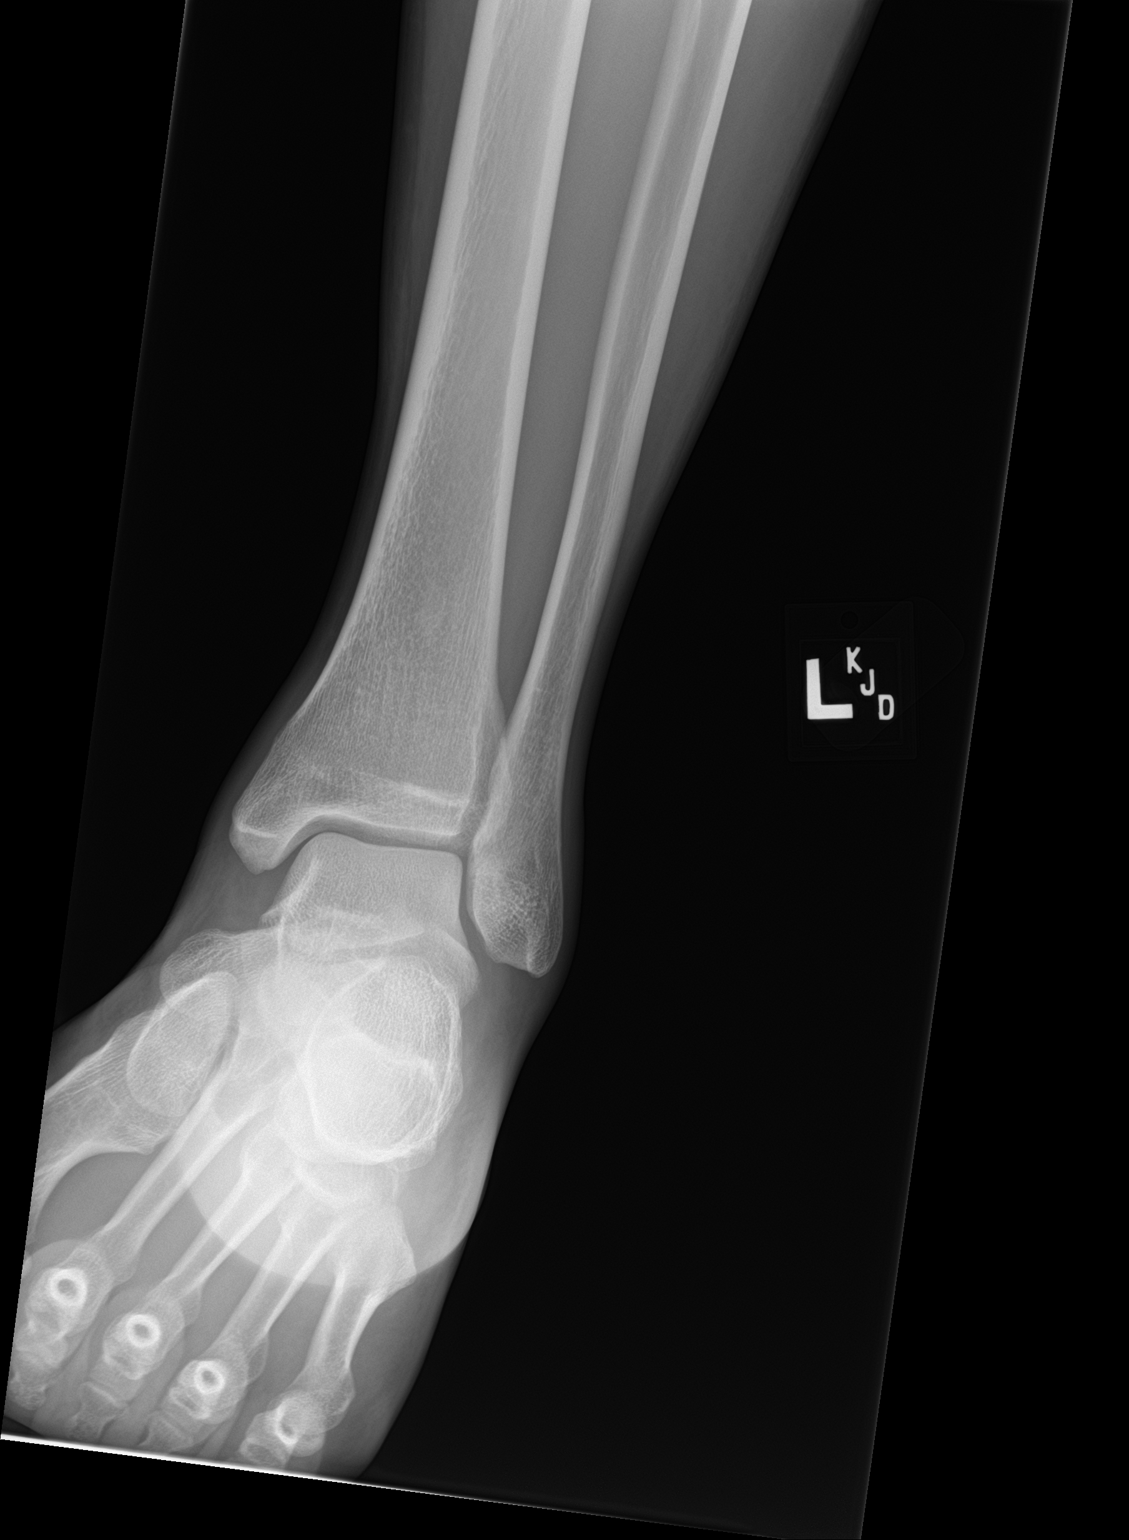

[ankle lat]
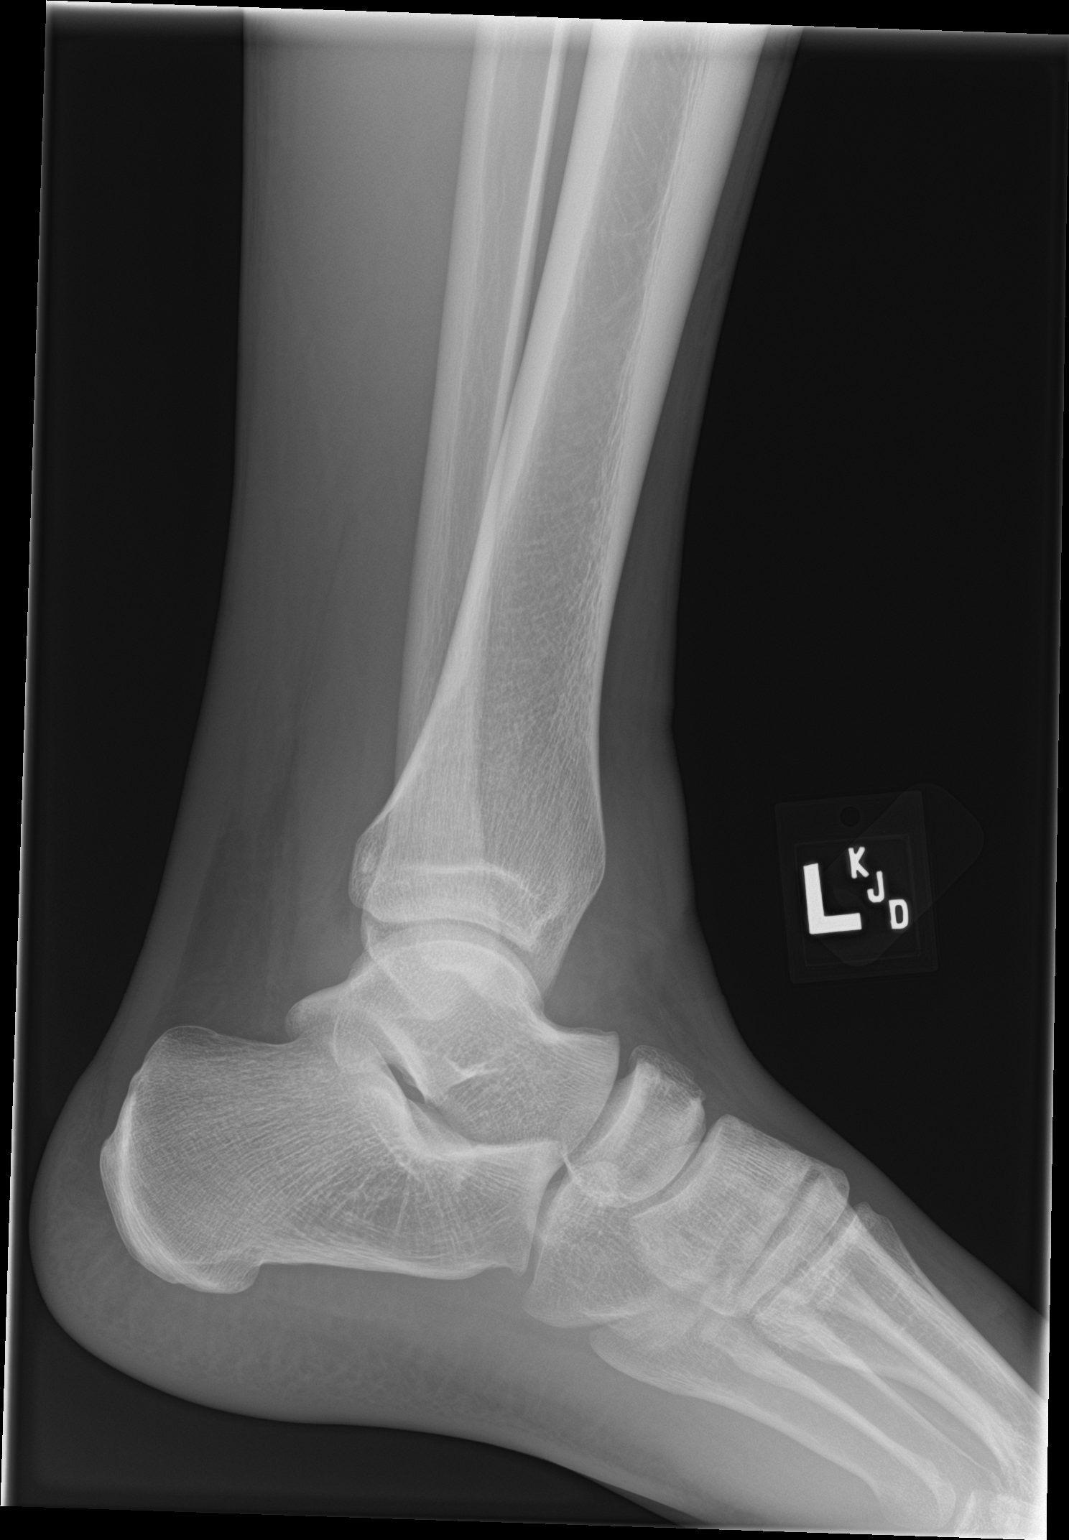

[3 of 3 positions shown; findings below may reference images not displayed]

FINDINGS: There is no evidence of fracture, dislocation, or joint effusion.
There is no evidence of arthropathy or other focal bone abnormality.
Soft tissues are unremarkable.
IMPRESSION: Negative.

## 2023-05-04 IMAGING — CR DG FOOT COMPLETE 3+V*L*
3 series · 3 of 3 positions shown · non-contrast
Comparison: No priors.

CLINICAL DATA: 25-year-old male with history of ankle pain for the
past several days.

EXAM:
LEFT ANKLE COMPLETE - 3+ VIEW; LEFT FOOT - COMPLETE 3+ VIEW

[foot ap]
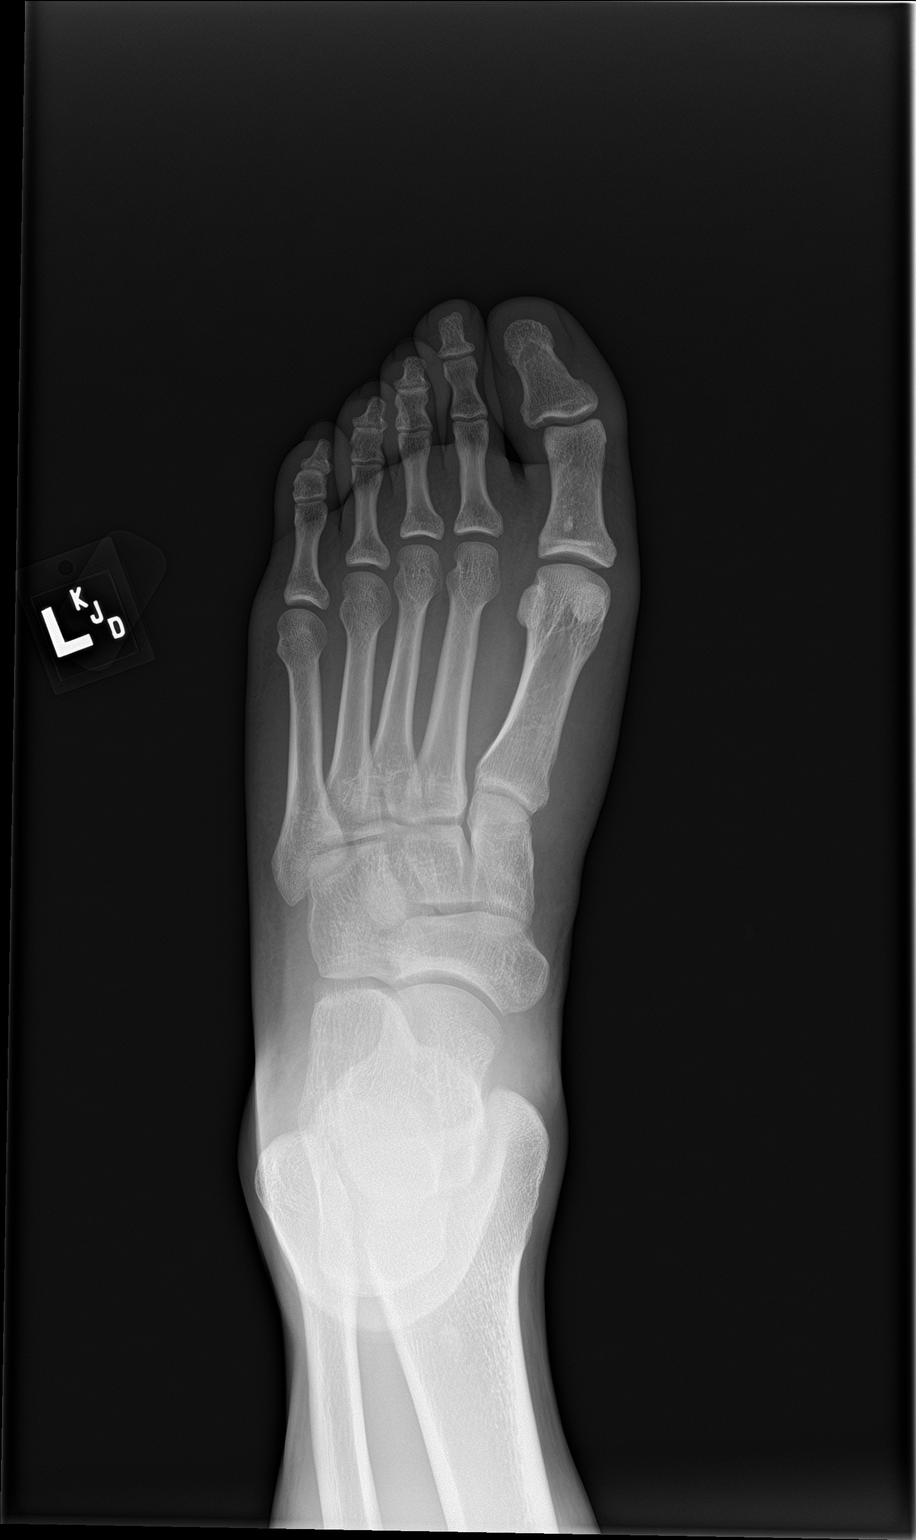

[foot obl]
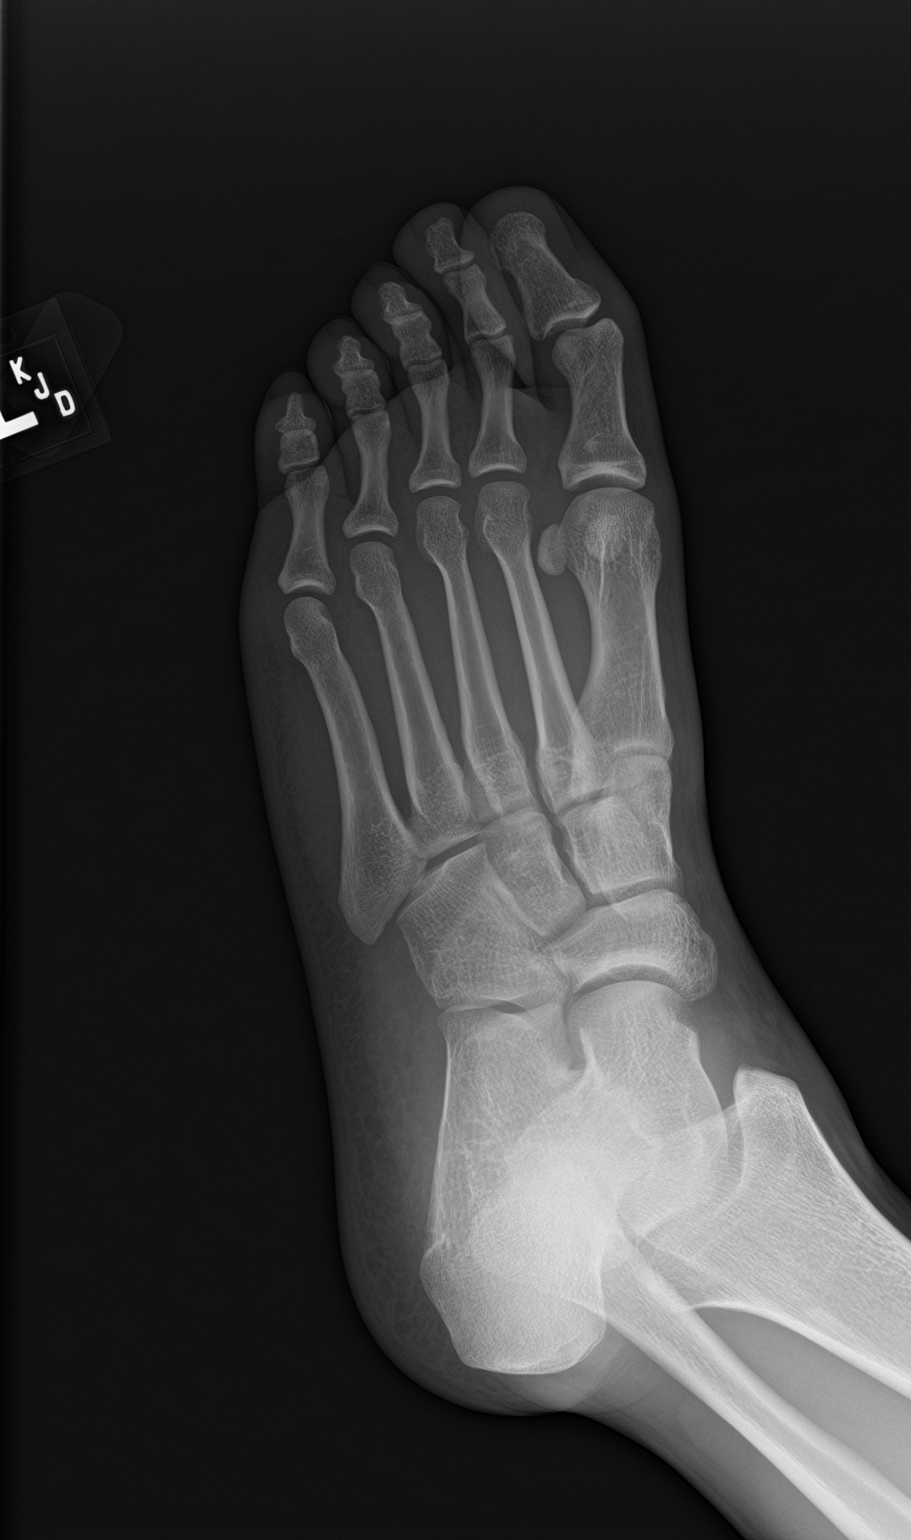

[foot lat]
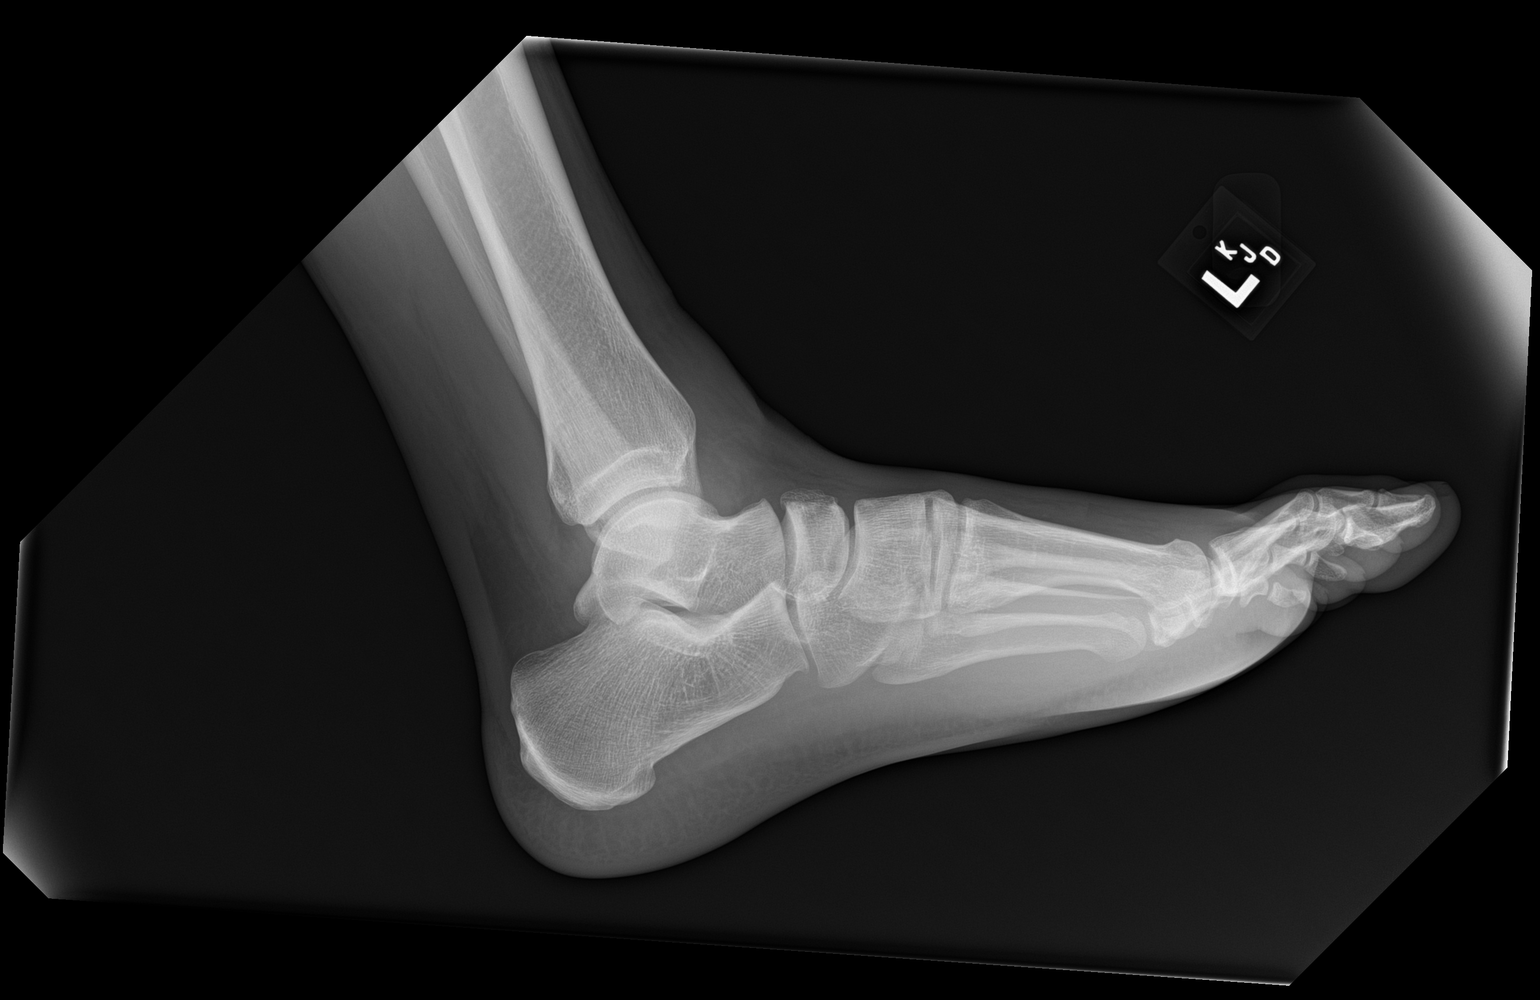

[3 of 3 positions shown; findings below may reference images not displayed]

FINDINGS: There is no evidence of fracture, dislocation, or joint effusion.
There is no evidence of arthropathy or other focal bone abnormality.
Soft tissues are unremarkable.
IMPRESSION: Negative.

## 2023-05-04 NOTE — Discharge Instructions (Addendum)
Your urine was negative for any signs of infection.  We have obtained screening for sexually transmitted infections and we will contact you if anything results is positive to initiate the appropriate treatment.  In the meantime, abstain from intercourse until all results are received.  Your testicular pain does not appear to be torsion, or other emergent causes.  If it persists, I suggest following up with your primary care provider, who can then refer you to urology if needed for further care.   Please return to clinic for any new or concerning symptoms.

## 2023-05-04 NOTE — ED Provider Notes (Signed)
[facility]    CSN: [id number] Arrival date & time: 05/04/23  1848      History   Chief Complaint Chief Complaint  Patient presents with   Testicle Pain   Dysuria    HPI Shawn [name] is a 27 y.o. male.   Patient presents to clinic for complaints of dysuria and testicular pain.  Reports he was last sexually active a week, maybe 2 ago.  He denies any discharge, penile sores or lesions.  No testicular swelling.  No fevers.  No known exposure to sexually transmitted infections.    The history is provided by the patient and medical records.  Testicle Pain Pertinent negatives include no abdominal pain.  Dysuria Presenting symptoms: dysuria   Presenting symptoms: no penile discharge and no penile pain   Associated symptoms: no abdominal pain, no fever, no flank pain, no hematuria, no penile swelling and no scrotal swelling     Past Medical History:  Diagnosis Date   ADHD (attention deficit hyperactivity disorder)    Asthma    Environmental allergies     There are no problems to display for this patient.   History reviewed. No pertinent surgical history.     Home Medications    Prior to Admission medications   Not on File    Family History Family History  Problem Relation Age of Onset   Healthy Mother     Social History Social History   Tobacco Use   Smoking status: Never   Smokeless tobacco: Never  Vaping Use   Vaping Use: Never used  Substance Use Topics   Alcohol use: Yes    Comment: "a lot"   Drug use: Yes    Types: Marijuana     Allergies   Patient has no known allergies.   Review of Systems Review of Systems  Constitutional:  Negative for fever.  Gastrointestinal:  Negative for abdominal pain.  Genitourinary:  Positive for dysuria and testicular pain. Negative for flank pain, genital sores, hematuria, penile discharge, penile pain, penile swelling and scrotal swelling.  Musculoskeletal:  Negative for back pain.      Physical Exam Triage Vital Signs ED Triage Vitals  Enc Vitals Group     BP 05/04/23 1856 120/75     Pulse Rate 05/04/23 1856 89     Resp 05/04/23 1856 16     Temp 05/04/23 1856 98 F (36.7 C)     Temp Source 05/04/23 1856 Oral     SpO2 05/04/23 1856 97 %     Weight --      Height --      Head Circumference --      Peak Flow --      Pain Score 05/04/23 1858 2     Pain Loc --      Pain Edu? --      Excl. in GC? --    No data found.  Updated Vital Signs BP 120/75   Pulse 89   Temp 98 F (36.7 C) (Oral)   Resp 16   SpO2 97%   Visual Acuity Right Eye Distance:   Left Eye Distance:   Bilateral Distance:    Right Eye Near:   Left Eye Near:    Bilateral Near:     Physical Exam Vitals and nursing note reviewed.  Constitutional:      Appearance: Normal appearance.  HENT:     Head: Normocephalic and atraumatic.     Right Ear: External ear normal.  Left Ear: External ear normal.     Nose: Nose normal.     Mouth/Throat:     Mouth: Mucous membranes are moist.  Eyes:     Conjunctiva/sclera: Conjunctivae normal.  Cardiovascular:     Rate and Rhythm: Normal rate.  Pulmonary:     Effort: Pulmonary effort is normal. No respiratory distress.  Musculoskeletal:        General: No swelling. Normal range of motion.  Skin:    General: Skin is warm and dry.  Neurological:     General: No focal deficit present.     Mental Status: He is alert.  Psychiatric:        Mood and Affect: Mood normal.      UC Treatments / Results  Labs (all labs ordered are listed, but only abnormal results are displayed) Labs Reviewed  RPR  HIV ANTIBODY (ROUTINE TESTING W REFLEX)  POCT URINALYSIS DIP (MANUAL ENTRY)  CYTOLOGY, (ORAL, ANAL, URETHRAL) ANCILLARY ONLY    EKG   Radiology No results found.  Procedures Procedures (including critical care time)  Medications Ordered in UC Medications - No data to display  Initial Impression / Assessment and Plan / UC Course   I have reviewed the triage vital signs and the nursing notes.  Pertinent labs & imaging results that were available during my care of the patient were reviewed by me and considered in my medical decision making (see chart for details).  Vitals and triage reviewed, patient is hemodynamically stable.  Patient complains of dysuria and testicular pain for the past 2 weeks, concern for STIs.  STI screening obtained in clinic.  Denies any fever, hematuria or scrotal swelling.  Doubt emergent etiology of torsion due to duration of testicular pain.  Encouraged to follow-up with the PCP and potentially urology if symptoms persist.  Staff to contact patient if labs result as abnormal.  Urinalysis unremarkable, no acute UTI.  Plan of care, follow-up care and return precautions given, no questions at this time.     Final Clinical Impressions(s) / UC Diagnoses   Final diagnoses:  Dysuria  Screening examination for sexually transmitted disease     Discharge Instructions      Your urine was negative for any signs of infection.  We have obtained screening for sexually transmitted infections and we will contact you if anything results is positive to initiate the appropriate treatment.  In the meantime, abstain from intercourse until all results are received.  Your testicular pain does not appear to be torsion, or other emergent causes.  If it persists, I suggest following up with your primary care provider, who can then refer you to urology if needed for further care.   Please return to clinic for any new or concerning symptoms.       ED Prescriptions   None    PDMP not reviewed this encounter.   Jordon Kristiansen, [place], [place] 05/04/23 1924

## 2023-05-04 NOTE — ED Triage Notes (Signed)
C/O left testicular pain and dysuria x 2 wks. Denies any penile discharge.

## 2023-05-05 ENCOUNTER — Telehealth (HOSPITAL_COMMUNITY): Payer: Self-pay | Admitting: Emergency Medicine

## 2023-05-05 LAB — RPR: RPR Ser Ql: NONREACTIVE

## 2023-05-05 LAB — CYTOLOGY, (ORAL, ANAL, URETHRAL) ANCILLARY ONLY
Chlamydia: NEGATIVE
Comment: NEGATIVE
Comment: NEGATIVE
Comment: NORMAL
Neisseria Gonorrhea: NEGATIVE
Trichomonas: POSITIVE — AB

## 2023-05-05 MED ORDER — METRONIDAZOLE 500 MG PO TABS: 2000.0000 mg | ORAL_TABLET | Freq: Once | ORAL | 0 refills | Status: AC

## 2023-05-14 ENCOUNTER — Other Ambulatory Visit: Payer: Self-pay

## 2023-05-14 ENCOUNTER — Ambulatory Visit (HOSPITAL_COMMUNITY)
Admission: EM | Admit: 2023-05-14 | Discharge: 2023-05-14 | Disposition: A | Discharge status: Home / Self Care | Payer: Medicaid Other | Attending: Family Medicine | Admitting: Family Medicine

## 2023-05-14 ENCOUNTER — Encounter (HOSPITAL_COMMUNITY): Payer: Self-pay | Admitting: Emergency Medicine

## 2023-05-14 DIAGNOSIS — A599 Trichomoniasis, unspecified: Secondary | ICD-10-CM

## 2023-05-14 MED ORDER — METRONIDAZOLE 500 MG PO TABS
2000.0000 mg | ORAL_TABLET | Freq: Once | ORAL | Status: AC
  Administered 2023-05-14: 2000 mg via ORAL

## 2023-05-14 MED ORDER — METRONIDAZOLE 500 MG PO TABS
ORAL_TABLET | ORAL | Status: AC
  Filled 2023-05-14: qty 4

## 2023-05-14 NOTE — ED Triage Notes (Signed)
Patient reports he saw in my chart he has trich.  Patient denies symptoms.

## 2023-05-14 NOTE — ED Notes (Signed)
Patient is on the phone texting throughout in take

## 2023-05-16 NOTE — ED Provider Notes (Signed)
[facility]   [id number] 05/14/23 Arrival Time: 1421  ASSESSMENT & PLAN:  1. Trichomonal infection    Meds ordered this encounter  Medications   metroNIDAZOLE (FLAGYL) tablet 2,000 mg   No symptoms. May return as needed.  Reviewed expectations re: course of current medical issues. Questions answered. Outlined signs and symptoms indicating need for more acute intervention. Patient verbalized understanding. After Visit Summary given.   SUBJECTIVE:  Shawn [name] is a 27 y.o. male who reports seeing + trich on MyChart. Here for tx. No symptoms.  OBJECTIVE:  Vitals:   05/14/23 1450  BP: 131/87  Pulse: 66  Resp: 18  Temp: 98.1 F (36.7 C)  TempSrc: Oral  SpO2: 98%    General appearance: alert, cooperative, appears stated age and no distress GU: deferred Skin: warm and dry Psychological: alert and cooperative; normal mood and affect.  Results for orders placed or performed during the hospital encounter of 05/04/23  RPR  Result Value Ref Range   RPR Ser Ql NON REACTIVE NON REACTIVE  HIV Antibody (routine testing w rflx)  Result Value Ref Range   HIV Screen 4th Generation wRfx Non Reactive Non Reactive  POC urinalysis dipstick  Result Value Ref Range   Color, UA yellow yellow   Clarity, UA clear clear   Glucose, UA negative negative mg/dL   Bilirubin, UA negative negative   Ketones, POC UA negative negative mg/dL   Spec Grav, UA 7.829 5.621 - 1.025   Blood, UA negative negative   pH, UA 7.0 5.0 - 8.0   Protein Ur, POC negative negative mg/dL   Urobilinogen, UA 1.0 0.2 or 1.0 E.U./dL   Nitrite, UA Negative Negative   Leukocytes, UA Negative Negative  Cytology ancillary only  Result Value Ref Range   Neisseria Gonorrhea Negative    Chlamydia Negative    Trichomonas Positive (A)    Comment Normal Reference Ranger Chlamydia - Negative    Comment      Normal Reference Range Neisseria Gonorrhea - Negative   Comment Normal Reference Range Trichomonas  - Negative     Labs Reviewed - No data to display  No Known Allergies  Past Medical History:  Diagnosis Date   ADHD (attention deficit hyperactivity disorder)    Asthma    Environmental allergies    Family History  Problem Relation Age of Onset   Healthy Mother    Social History   Socioeconomic History   Marital status: Single    Spouse name: Not on file   Number of children: Not on file   Years of education: Not on file   Highest education level: Not on file  Occupational History   Not on file  Tobacco Use   Smoking status: Never   Smokeless tobacco: Never  Vaping Use   Vaping Use: Never used  Substance and Sexual Activity   Alcohol use: Yes    Comment: "a lot"   Drug use: Yes    Types: Marijuana   Sexual activity: Yes  Other Topics Concern   Not on file  Social History Narrative   Not on file   Social Determinants of Health   Financial Resource Strain: Not on file  Food Insecurity: Not on file  Transportation Needs: Not on file  Physical Activity: Not on file  Stress: Not on file  Social Connections: Not on file  Intimate Partner Violence: Not on file           Mardella Layman, MD 05/16/23 [phone]

## 2023-05-19 ENCOUNTER — Emergency Department (HOSPITAL_COMMUNITY)
Admission: EM | Admit: 2023-05-19 | Discharge: 2023-05-19 | Disposition: A | Discharge status: Home / Self Care | Payer: Medicaid Other | Attending: Emergency Medicine | Admitting: Emergency Medicine

## 2023-05-19 DIAGNOSIS — F12122 Cannabis abuse with intoxication with perceptual disturbance: Secondary | ICD-10-CM | POA: Insufficient documentation

## 2023-05-19 DIAGNOSIS — F410 Panic disorder [episodic paroxysmal anxiety] without agoraphobia: Secondary | ICD-10-CM | POA: Diagnosis present

## 2023-05-19 DIAGNOSIS — F12922 Cannabis use, unspecified with intoxication with perceptual disturbance: Secondary | ICD-10-CM

## 2023-05-19 NOTE — Discharge Instructions (Signed)
Be careful with what ever you take that is not regulated by the FDA.  He is follow-up with family doctor in the office.

## 2023-05-19 NOTE — ED Triage Notes (Signed)
Patient c/o anxiety attack around 30 minutes ago States he is unable to control breathing Endorses history of panic attacks  Denies pain

## 2023-05-19 NOTE — ED Provider Notes (Signed)
[facility] Provider Note   CSN: [id number] Arrival date & time: 05/19/23  0355     History  Chief Complaint  Patient presents with   Panic Attack    Shawn [name] is a 27 y.o. male.  27 yo M with a chief complaint of feeling panicked after he smoked marijuana tonight.  He feels a bit better than at the onset but still feels like he could come back.  Has had issues with anxiety in the past.  Felt fine earlier today.  Denies cough congestion or fever denies chest pain.        Home Medications Prior to Admission medications   Not on File      Allergies    Patient has no known allergies.    Review of Systems   Review of Systems  Physical Exam Updated Vital Signs BP (!) 155/76 (BP Location: Right Arm)   Pulse 81   Temp 97.9 F (36.6 C) (Oral)   Resp 18   Ht 5\' 11"  (1.803 m)   Wt 77.1 kg   SpO2 97%   BMI 23.71 kg/m  Physical Exam Vitals and nursing note reviewed.  Constitutional:      Appearance: He is well-developed.  HENT:     Head: Normocephalic and atraumatic.  Eyes:     Pupils: Pupils are equal, round, and reactive to light.  Neck:     Vascular: No JVD.  Cardiovascular:     Rate and Rhythm: Normal rate and regular rhythm.     Heart sounds: No murmur heard.    No friction rub. No gallop.  Pulmonary:     Effort: No respiratory distress.     Breath sounds: No wheezing.  Abdominal:     General: There is no distension.     Tenderness: There is no abdominal tenderness. There is no guarding or rebound.  Musculoskeletal:        General: Normal range of motion.     Cervical back: Normal range of motion and neck supple.  Skin:    Coloration: Skin is not pale.     Findings: No rash.  Neurological:     Mental Status: He is alert and oriented to person, place, and time.  Psychiatric:        Behavior: Behavior normal.     ED Results / Procedures / Treatments   Labs (all labs ordered are listed, but only  abnormal results are displayed) Labs Reviewed - No data to display  EKG EKG Interpretation Date/Time:  Thursday May 19 2023 04:06:41 EDT Ventricular Rate:  83 PR Interval:  129 QRS Duration:  86 QT Interval:  337 QTC Calculation: 396 R Axis:   55  Text Interpretation: Sinus rhythm Atrial premature complex Borderline ST elevation, anterior leads No old tracing to compare Confirmed by Melene Plan [id number]) on 05/19/2023 4:08:52 AM  Radiology No results found.  Procedures .1-3 Lead EKG Interpretation  Performed by: Melene Plan, DO Authorized by: Melene Plan, DO     Interpretation: normal     ECG rate:  81   ECG rate assessment: normal     Rhythm: sinus rhythm     Ectopy: none     Conduction: normal       Medications Ordered in ED Medications - No data to display  ED Course/ Medical Decision Making/ A&P  Medical Decision Making Amount and/or Complexity of Data Reviewed ECG/medicine tests: ordered.   27 yo M with a chief complaint of feeling a bit anxious after smoking marijuana.  Patient is well-appearing nontoxic.  Has no tachycardia has clear lung sounds for me.  He would prefer to be observed for a bit longer in the ER.  Do not feel he benefit from laboratory evaluation or IV fluids.  Patient continues to be well.  No ectopy.  No concern for difficulty breathing.  Will discharge home.  PCP follow-up.  6:31 AM:  I have discussed the diagnosis/risks/treatment options with the patient.  Evaluation and diagnostic testing in the emergency department does not suggest an emergent condition requiring admission or immediate intervention beyond what has been performed at this time.  They will follow up with PCP. We also discussed returning to the ED immediately if new or worsening sx occur. We discussed the sx which are most concerning (e.g., sudden worsening pain, fever, inability to tolerate by mouth) that necessitate immediate return. Medications  administered to the patient during their visit and any new prescriptions provided to the patient are listed below.  Medications given during this visit Medications - No data to display   The patient appears reasonably screen and/or stabilized for discharge and I doubt any other medical condition or other [facility] requiring further screening, evaluation, or treatment in the ED at this time prior to discharge.          Final Clinical Impression(s) / ED Diagnoses Final diagnoses:  Marijuana intoxication, with perceptual disturbance [facility])    Rx / DC Orders ED Discharge Orders     None         Melene Plan, DO 05/19/23 8119

## 2023-05-19 NOTE — ED Notes (Signed)
Patient reports he smoked some marijuana tonight and doesn;'t usually do this. Panic attack started after this

## 2023-06-11 ENCOUNTER — Encounter (HOSPITAL_COMMUNITY): Payer: Self-pay

## 2023-06-11 ENCOUNTER — Ambulatory Visit (HOSPITAL_COMMUNITY)
Admission: EM | Admit: 2023-06-11 | Discharge: 2023-06-11 | Disposition: A | Discharge status: Home / Self Care | Payer: Medicaid Other | Source: Home / Self Care | Attending: Family Medicine | Admitting: Family Medicine

## 2023-06-11 DIAGNOSIS — Z202 Contact with and (suspected) exposure to infections with a predominantly sexual mode of transmission: Secondary | ICD-10-CM | POA: Diagnosis not present

## 2023-06-11 DIAGNOSIS — Z113 Encounter for screening for infections with a predominantly sexual mode of transmission: Secondary | ICD-10-CM | POA: Diagnosis not present

## 2023-06-11 MED ORDER — METRONIDAZOLE 500 MG PO TABS
2000.0000 mg | ORAL_TABLET | Freq: Once | ORAL | Status: AC
  Administered 2023-06-11: 2000 mg via ORAL

## 2023-06-11 MED ORDER — METRONIDAZOLE 500 MG PO TABS
ORAL_TABLET | ORAL | Status: AC
  Filled 2023-06-11: qty 1

## 2023-06-11 MED ORDER — METRONIDAZOLE 500 MG PO TABS
ORAL_TABLET | ORAL | Status: AC
  Filled 2023-06-11: qty 3

## 2023-06-11 NOTE — Discharge Instructions (Signed)
Meds ordered this encounter  Medications   metroNIDAZOLE (FLAGYL) tablet 2,000 mg    We have sent testing for sexually transmitted infections. We will notify you of any positive results once they are received. If required, we will prescribe any medications you might need.  Please refrain from all sexual activity for at least the next seven days.  

## 2023-06-11 NOTE — ED Triage Notes (Signed)
Patient here today due to having some penile discomfort X 2 days. Patient tested positive for Trich last month. He did complete his medication but thinks that he and his girlfriend have been passing it back and forth.

## 2023-06-13 LAB — CYTOLOGY, (ORAL, ANAL, URETHRAL) ANCILLARY ONLY
Chlamydia: NEGATIVE
Comment: NEGATIVE
Comment: NEGATIVE
Comment: NORMAL
Neisseria Gonorrhea: NEGATIVE
Trichomonas: NEGATIVE

## 2023-06-13 NOTE — ED Provider Notes (Signed)
  [facility]   [id number] 06/11/23 Arrival Time: 1420  ASSESSMENT & PLAN:  1. Screening for STDs (sexually transmitted diseases)   2. Exposure to trichomonas       Discharge Instructions       Meds ordered this encounter  Medications   metroNIDAZOLE (FLAGYL) tablet 2,000 mg     We have sent testing for sexually transmitted infections. We will notify you of any positive results once they are received. If required, we will prescribe any medications you might need.  Please refrain from all sexual activity for at least the next seven days.     Pending: Labs Reviewed  CYTOLOGY, (ORAL, ANAL, URETHRAL) ANCILLARY ONLY    Will notify of any positive results. Instructed to refrain from sexual activity for at least seven days.  Reviewed expectations re: course of current medical issues. Questions answered. Outlined signs and symptoms indicating need for more acute intervention. Patient verbalized understanding. After Visit Summary given.   SUBJECTIVE:  Shawn [name] is a 27 y.o. male who reports mild penile discomfort X 2 days. Patient tested positive for Trich last month. He did complete his medication but thinks that he and his girlfriend have been passing it back and forth.  Feels he needs testing/tx again.  OBJECTIVE:  Vitals:   06/11/23 1434 06/11/23 1435  BP:  123/73  Pulse:  81  Resp:  16  Temp:  98.8 F (37.1 C)  TempSrc:  Oral  SpO2:  97%  Weight: 75.8 kg   Height: 5\' 11"  (1.803 m)      General appearance: alert, cooperative, appears stated age and no distress Throat: lips, mucosa, and tongue normal; teeth and gums normal Lungs: unlabored respirations; speaks full sentences without difficulty Back: no CVA tenderness; FROM at waist Abdomen: soft, non-tender GU: normal appearing genitalia Skin: warm and dry Psychological: alert and cooperative; normal mood and affect.    Labs Reviewed  CYTOLOGY, (ORAL, ANAL, URETHRAL) ANCILLARY ONLY     No Known Allergies  Past Medical History:  Diagnosis Date   ADHD (attention deficit hyperactivity disorder)    Asthma    Environmental allergies    Family History  Problem Relation Age of Onset   Healthy Mother    Social History   Socioeconomic History   Marital status: Single    Spouse name: Not on file   Number of children: Not on file   Years of education: Not on file   Highest education level: Not on file  Occupational History   Not on file  Tobacco Use   Smoking status: Never   Smokeless tobacco: Never  Vaping Use   Vaping status: Never Used  Substance and Sexual Activity   Alcohol use: Yes    Comment: "a lot"   Drug use: Yes    Types: Marijuana   Sexual activity: Yes  Other Topics Concern   Not on file  Social History Narrative   Not on file   Social Determinants of Health   Financial Resource Strain: Not on file  Food Insecurity: Not on file  Transportation Needs: Not on file  Physical Activity: Not on file  Stress: Not on file  Social Connections: Not on file  Intimate Partner Violence: Not on file           Mardella Layman, MD 06/13/23 1318

## 2023-06-16 ENCOUNTER — Telehealth (HOSPITAL_COMMUNITY): Payer: Self-pay | Admitting: Emergency Medicine

## 2023-06-16 NOTE — Telephone Encounter (Signed)
Letter sent for results to notify patient was returned "no such street, unable to forward".

## 2023-08-02 ENCOUNTER — Ambulatory Visit
Admission: RE | Admit: 2023-08-02 | Discharge: 2023-08-02 | Disposition: A | Discharge status: Home / Self Care | Payer: Medicaid Other | Source: Ambulatory Visit | Attending: Internal Medicine | Admitting: Internal Medicine

## 2023-08-02 ENCOUNTER — Ambulatory Visit (HOSPITAL_COMMUNITY): Payer: Self-pay

## 2023-08-02 VITALS — BP 124/74 | HR 57 | Temp 98.1°F | Resp 16

## 2023-08-02 DIAGNOSIS — Z202 Contact with and (suspected) exposure to infections with a predominantly sexual mode of transmission: Secondary | ICD-10-CM | POA: Insufficient documentation

## 2023-08-02 DIAGNOSIS — Z113 Encounter for screening for infections with a predominantly sexual mode of transmission: Secondary | ICD-10-CM | POA: Insufficient documentation

## 2023-08-02 DIAGNOSIS — N4889 Other specified disorders of penis: Secondary | ICD-10-CM | POA: Insufficient documentation

## 2023-08-02 LAB — POCT URINALYSIS DIP (MANUAL ENTRY)
Bilirubin, UA: NEGATIVE
Blood, UA: NEGATIVE
Glucose, UA: NEGATIVE mg/dL
Ketones, POC UA: NEGATIVE mg/dL
Leukocytes, UA: NEGATIVE
Nitrite, UA: NEGATIVE
Protein Ur, POC: NEGATIVE mg/dL
Spec Grav, UA: 1.02 (ref 1.010–1.025)
Urobilinogen, UA: 0.2 U/dL
pH, UA: 8.5 — AB (ref 5.0–8.0)

## 2023-08-02 MED ORDER — METRONIDAZOLE 500 MG PO TABS
500.0000 mg | ORAL_TABLET | Freq: Two times a day (BID) | ORAL | 0 refills | Status: DC
Start: 2023-08-02 — End: 2023-08-23

## 2023-08-02 NOTE — Discharge Instructions (Addendum)
The clinical will contact you with results of the STD testing done today if positive.  Start metronidazole twice daily for 7 days.  Abstain from intercourse while on treatment as well as 7 days after you complete treatment.  Please follow-up with your PCP if symptoms do not improve.  Please go to the ER for any worsening symptoms.  I hope you feel better soon!

## 2023-08-02 NOTE — ED Provider Notes (Signed)
[facility]    CSN: [id number] Arrival date & time: 08/02/23  1502      History   Chief Complaint Chief Complaint  Patient presents with   Pelvic Pain    HPI Shawn [name] is a 27 y.o. male presents for STD testing/dysuria.  Patient reports 4 days with some penile irritation/discomfort with mild dysuria.  Denies any penile discharge, testicular pain or swelling, hematuria, fevers or chills.  States he was treated for trichomonas in early August with a one-time dose of metronidazole.  States he did not wait for 7 days prior to engaging in intercourse with the same partner and feels he has contracted trichomonas again.  States the symptoms he currently has are consistent with previous trichomonas infections.  Denies any known STD exposure.  No other concerns at this time.   Pelvic Pain    Past Medical History:  Diagnosis Date   ADHD (attention deficit hyperactivity disorder)    Asthma    Environmental allergies     There are no problems to display for this patient.   No past surgical history on file.     Home Medications    Prior to Admission medications   Medication Sig Start Date End Date Taking? Authorizing Provider  metroNIDAZOLE (FLAGYL) 500 MG tablet Take 1 tablet (500 mg total) by mouth 2 (two) times daily. 08/02/23  Yes Radford Pax, NP    Family History Family History  Problem Relation Age of Onset   Healthy Mother     Social History Social History   Tobacco Use   Smoking status: Never   Smokeless tobacco: Never  Vaping Use   Vaping status: Never Used  Substance Use Topics   Alcohol use: Yes    Comment: "a lot"   Drug use: Yes    Types: Marijuana     Allergies   Patient has no known allergies.   Review of Systems Review of Systems  Genitourinary:  Positive for dysuria and pelvic pain.       Penile discomfort     Physical Exam Triage Vital Signs ED Triage Vitals  Encounter Vitals Group     BP 08/02/23 1518 124/74      Systolic BP Percentile --      Diastolic BP Percentile --      Pulse Rate 08/02/23 1518 (!) 57     Resp 08/02/23 1518 16     Temp 08/02/23 1518 98.1 F (36.7 C)     Temp Source 08/02/23 1518 Oral     SpO2 08/02/23 1518 96 %     Weight --      Height --      Head Circumference --      Peak Flow --      Pain Score 08/02/23 1522 0     Pain Loc --      Pain Education --      Exclude from Growth Chart --    No data found.  Updated Vital Signs BP 124/74 (BP Location: Right Arm)   Pulse (!) 57   Temp 98.1 F (36.7 C) (Oral)   Resp 16   SpO2 96%   Visual Acuity Right Eye Distance:   Left Eye Distance:   Bilateral Distance:    Right Eye Near:   Left Eye Near:    Bilateral Near:     Physical Exam Vitals and nursing note reviewed.  Constitutional:      General: He is not in acute distress.  Appearance: Normal appearance. He is not ill-appearing.  HENT:     Head: Normocephalic and atraumatic.  Eyes:     Pupils: Pupils are equal, round, and reactive to light.  Cardiovascular:     Rate and Rhythm: Normal rate.  Pulmonary:     Effort: Pulmonary effort is normal.  Skin:    General: Skin is warm and dry.  Neurological:     General: No focal deficit present.     Mental Status: He is alert and oriented to person, place, and time.  Psychiatric:        Mood and Affect: Mood normal.        Behavior: Behavior normal.      UC Treatments / Results  Labs (all labs ordered are listed, but only abnormal results are displayed) Labs Reviewed  POCT URINALYSIS DIP (MANUAL ENTRY) - Abnormal; Notable for the following components:      Result Value   pH, UA 8.5 (*)    All other components within normal limits  RPR  HIV ANTIBODY (ROUTINE TESTING W REFLEX)  CYTOLOGY, (ORAL, ANAL, URETHRAL) ANCILLARY ONLY    EKG   Radiology No results found.  Procedures Procedures (including critical care time)  Medications Ordered in UC Medications - No data to display  Initial  Impression / Assessment and Plan / UC Course  I have reviewed the triage vital signs and the nursing notes.  Pertinent labs & imaging results that were available during my care of the patient were reviewed by me and considered in my medical decision making (see chart for details).     Reviewed exam and symptoms with patient.  No red flags.  UA negative.  STD testing is ordered and will contact for any positive results.  Patient reports reoccurring trichomonas type symptoms.  Last treated August 1 with 1 dose of metronidazole.  Per guidelines reoccurrence require 7-day treatment.  Will start metronidazole twice daily for 7 days.  It was reiterated no sexual intercourse while on treatment and for 7 days after treatment is complete and he verbalized understanding.  PCP follow-up if symptoms do not improve.  ER precautions reviewed and patient verbalized understanding. Final Clinical Impressions(s) / UC Diagnoses   Final diagnoses:  Penile irritation  Screening examination for STD (sexually transmitted disease)  Exposure to trichomonas     Discharge Instructions      The clinical will contact you with results of the STD testing done today if positive.  Start metronidazole twice daily for 7 days.  Abstain from intercourse while on treatment as well as 7 days after you complete treatment.  Please follow-up with your PCP if symptoms do not improve.  Please go to the ER for any worsening symptoms.  I hope you feel better soon!     ED Prescriptions     Medication Sig Dispense Auth. Provider   metroNIDAZOLE (FLAGYL) 500 MG tablet Take 1 tablet (500 mg total) by mouth 2 (two) times daily. 14 tablet Radford Pax, NP      PDMP not reviewed this encounter.   Radford Pax, NP 08/02/23 1550

## 2023-08-02 NOTE — ED Triage Notes (Signed)
Pt presents to UC w/ c/o pelvic pain, dysuria x4 days. Pt states he was recently treated for trichomonas but states he did not wait 7 days to have unprotected intercourse with his partner who was also positive but receiving treatment.

## 2023-08-23 ENCOUNTER — Encounter (HOSPITAL_COMMUNITY): Payer: Self-pay | Admitting: *Deleted

## 2023-08-23 ENCOUNTER — Ambulatory Visit (HOSPITAL_COMMUNITY)
Admission: EM | Admit: 2023-08-23 | Discharge: 2023-08-23 | Disposition: A | Discharge status: Home / Self Care | Payer: Medicaid Other | Attending: Emergency Medicine | Admitting: Emergency Medicine

## 2023-08-23 DIAGNOSIS — Z113 Encounter for screening for infections with a predominantly sexual mode of transmission: Secondary | ICD-10-CM | POA: Insufficient documentation

## 2023-08-23 LAB — HIV ANTIBODY (ROUTINE TESTING W REFLEX): HIV Screen 4th Generation wRfx: NONREACTIVE

## 2023-08-23 NOTE — ED Provider Notes (Signed)
[facility]    CSN: [id number] Arrival date & time: 08/23/23  1928      History   Chief Complaint Chief Complaint  Patient presents with   SEXUALLY TRANSMITTED DISEASE   Hip Pain    HPI Shawn [name] is a 27 y.o. male.   Patient presents for STD screening.  Denies any symptoms or known exposures. Patient reports mild left sided hip pain over the last few days.  Denies any known injuries.   Hip Pain Pertinent negatives include no abdominal pain.    Past Medical History:  Diagnosis Date   ADHD (attention deficit hyperactivity disorder)    Asthma    Environmental allergies     There are no problems to display for this patient.   History reviewed. No pertinent surgical history.     Home Medications    Prior to Admission medications   Not on File    Family History Family History  Problem Relation Age of Onset   Healthy Mother     Social History Social History   Tobacco Use   Smoking status: Never   Smokeless tobacco: Never  Vaping Use   Vaping status: Never Used  Substance Use Topics   Alcohol use: Yes    Comment: "a lot"   Drug use: Yes    Types: Marijuana     Allergies   Patient has no known allergies.   Review of Systems Review of Systems  Constitutional:  Negative for fever.  Gastrointestinal:  Negative for abdominal pain.  Genitourinary:  Negative for dysuria, flank pain, frequency, genital sores, hematuria, penile pain, penile swelling, scrotal swelling, testicular pain and urgency.  Musculoskeletal:  Positive for myalgias. Negative for back pain and gait problem.     Physical Exam Triage Vital Signs ED Triage Vitals [08/23/23 2001]  Encounter Vitals Group     BP 126/70     Systolic BP Percentile      Diastolic BP Percentile      Pulse Rate 60     Resp 18     Temp 98.2 F (36.8 C)     Temp Source Oral     SpO2 96 %     Weight      Height      Head Circumference      Peak Flow      Pain Score 0     Pain Loc       Pain Education      Exclude from Growth Chart    No data found.  Updated Vital Signs BP 126/70 (BP Location: Left Arm)   Pulse 60   Temp 98.2 F (36.8 C) (Oral)   Resp 18   SpO2 96%   Visual Acuity Right Eye Distance:   Left Eye Distance:   Bilateral Distance:    Right Eye Near:   Left Eye Near:    Bilateral Near:     Physical Exam Vitals and nursing note reviewed.  Constitutional:      General: He is awake. He is not in acute distress.    Appearance: Normal appearance. He is well-developed and well-groomed. He is not ill-appearing, toxic-appearing or diaphoretic.  Genitourinary:    Comments: GU exam deferred. Musculoskeletal:     Left hip: Normal.  Neurological:     Mental Status: He is alert.  Psychiatric:        Behavior: Behavior is cooperative.      UC Treatments / Results  Labs (all labs ordered are listed,  but only abnormal results are displayed) Labs Reviewed  HIV ANTIBODY (ROUTINE TESTING W REFLEX)  RPR  CYTOLOGY, (ORAL, ANAL, URETHRAL) ANCILLARY ONLY    EKG   Radiology No results found.  Procedures Procedures (including critical care time)  Medications Ordered in UC Medications - No data to display  Initial Impression / Assessment and Plan / UC Course  I have reviewed the triage vital signs and the nursing notes.  Pertinent labs & imaging results that were available during my care of the patient were reviewed by me and considered in my medical decision making (see chart for details).     Patient presented for STD screening.  Denies any symptoms or known exposures.  Patient also endorses mild left hip pain over the last few days.  Denies any known injuries.  No significant findings upon assessment.  GU exam deferred.  Patient performed self swab for STD.  HIV and syphilis testing ordered.  Recommended Tylenol ibuprofen as needed for head pain.  Discussed return and follow-up precautions. Final Clinical Impressions(s) / UC Diagnoses    Final diagnoses:  Screening for STD (sexually transmitted disease)     Discharge Instructions      Your results will come back over the next few days and someone will call you if they are positive and you require treatment.  You can take Tylenol and ibuprofen as needed for hip pain.  Return here as needed.    ED Prescriptions   None    PDMP not reviewed this encounter.   Wynonia Lawman A, NP 08/23/23 2021

## 2023-08-23 NOTE — Discharge Instructions (Signed)
Your results will come back over the next few days and someone will call you if they are positive and you require treatment.  You can take Tylenol and ibuprofen as needed for hip pain.  Return here as needed.

## 2023-08-23 NOTE — ED Triage Notes (Signed)
Pt states he would like STI testing done. He states he has pelvic pain x 2 days.

## 2023-08-24 LAB — CYTOLOGY, (ORAL, ANAL, URETHRAL) ANCILLARY ONLY
Chlamydia: NEGATIVE
Comment: NEGATIVE
Comment: NEGATIVE
Comment: NORMAL
Neisseria Gonorrhea: NEGATIVE
Trichomonas: NEGATIVE

## 2023-08-24 LAB — RPR: RPR Ser Ql: NONREACTIVE

## 2024-01-12 ENCOUNTER — Other Ambulatory Visit: Payer: Self-pay

## 2024-01-12 ENCOUNTER — Ambulatory Visit
Admission: EM | Admit: 2024-01-12 | Discharge: 2024-01-12 | Disposition: A | Discharge status: Home / Self Care | Payer: Medicaid Other | Attending: Family Medicine | Admitting: Family Medicine

## 2024-01-12 VITALS — BP 117/74 | HR 71 | Temp 98.6°F | Resp 16

## 2024-01-12 DIAGNOSIS — Z113 Encounter for screening for infections with a predominantly sexual mode of transmission: Secondary | ICD-10-CM | POA: Diagnosis present

## 2024-01-12 MED ORDER — CEFTRIAXONE SODIUM 500 MG IJ SOLR: 500.0000 mg | Freq: Once | INTRAMUSCULAR | Status: DC

## 2024-01-12 NOTE — ED Provider Notes (Addendum)
 [facility]    CSN: [id number] Arrival date & time: 01/12/24  1146      History   Chief Complaint Chief Complaint  Patient presents with   Exposure to STD    HPI Shawn [name] is a 28 y.o. male.  Patient presents today for STD screening.  Patient is symptomatic with dysuria.  He reports last sexual contact was approximately 3 weeks ago and it was unprotected.  No known exposure to STDs.  Past Medical History:  Diagnosis Date   ADHD (attention deficit hyperactivity disorder)    Asthma    Environmental allergies     There are no active problems to display for this patient.   History reviewed. No pertinent surgical history.     Home Medications    Prior to Admission medications   Not on File    Family History Family History  Problem Relation Age of Onset   Healthy Mother     Social History Social History   Tobacco Use   Smoking status: Never   Smokeless tobacco: Never  Vaping Use   Vaping status: Never Used  Substance Use Topics   Alcohol use: Not Currently    Comment: "a lot"   Drug use: Not Currently    Types: Marijuana     Allergies   Patient has no known allergies.   Review of Systems Review of Systems Pertinent negatives listed in HPI   Physical Exam Triage Vital Signs ED Triage Vitals  Encounter Vitals Group     BP 01/12/24 1221 117/74     Systolic BP Percentile --      Diastolic BP Percentile --      Pulse Rate 01/12/24 1221 71     Resp 01/12/24 1221 16     Temp 01/12/24 1221 98.6 F (37 C)     Temp Source 01/12/24 1221 Oral     SpO2 01/12/24 1221 98 %     Weight --      Height --      Head Circumference --      Peak Flow --      Pain Score 01/12/24 1215 3     Pain Loc --      Pain Education --      Exclude from Growth Chart --    No data found.  Updated Vital Signs BP 117/74 (BP Location: Left Arm)   Pulse 71   Temp 98.6 F (37 C) (Oral)   Resp 16   SpO2 98%   Visual Acuity Right Eye Distance:    Left Eye Distance:   Bilateral Distance:    Right Eye Near:   Left Eye Near:    Bilateral Near:     Physical Exam Constitutional:      General: He is not in acute distress.    Appearance: Normal appearance. He is not ill-appearing.  HENT:     Head: Normocephalic and atraumatic.  Eyes:     Extraocular Movements: Extraocular movements intact.     Conjunctiva/sclera: Conjunctivae normal.     Pupils: Pupils are equal, round, and reactive to light.  Cardiovascular:     Rate and Rhythm: Normal rate.  Pulmonary:     Effort: Pulmonary effort is normal.     Breath sounds: Normal breath sounds.  Musculoskeletal:     Cervical back: Normal range of motion.  Neurological:     General: No focal deficit present.     Mental Status: He is alert and oriented to person,  place, and time.   Cytology swab  self collected UC Treatments / Results  Labs (all labs ordered are listed, but only abnormal results are displayed) Labs Reviewed  HIV ANTIBODY (ROUTINE TESTING W REFLEX)  RPR  CYTOLOGY, (ORAL, ANAL, URETHRAL) ANCILLARY ONLY    EKG   Radiology No results found.  Procedures Procedures (including critical care time)  Medications Ordered in UC Medications - No data to display   Initial Impression / Assessment and Plan / UC Course  I have reviewed the triage vital signs and the nursing notes.  Pertinent labs & imaging results that were available during my care of the patient were reviewed by me and considered in my medical decision making (see chart for details).    Patient here for routine STD screening patient is symptomatic with dysuria is unknown if she has had any specific STD exposure.  To treat patient with Rocephin for possible gonorrhea or urethritis, he declined treatment today.  Patient would like to await his results.  Advised that office will follow-up with results however there are no results clinical staff available on the weekends.  Patient advised to review his  MyChart any questions he has after review of his results to call this office directly for mentations if indicated. Final Clinical Impressions(s) / UC Diagnoses   Final diagnoses:  Screen for STD (sexually transmitted disease)     Discharge Instructions      Results will update to MyChart within 24 to 48 hours.  Our office will reach out to you however there is no coverage on the weekends.  If you see any abnormalities on your lab results call directly to the office for other instructions.     ED Prescriptions   None    PDMP not reviewed this encounter.   Bing Neighbors, NP 01/12/24 1255    Bing Neighbors, NP 01/12/24 1256

## 2024-01-12 NOTE — ED Triage Notes (Signed)
 Pt reports intermittent burning with urination. Requesting STI testing- had unprotected sex 3 weeks ago

## 2024-01-12 NOTE — Discharge Instructions (Signed)
 Results will update to MyChart within 24 to 48 hours.  Our office will reach out to you however there is no coverage on the weekends.  If you see any abnormalities on your lab results call directly to the office for other instructions.

## 2024-01-13 ENCOUNTER — Encounter: Payer: Self-pay | Admitting: Family Medicine

## 2024-01-13 LAB — CYTOLOGY, (ORAL, ANAL, URETHRAL) ANCILLARY ONLY
Chlamydia: NEGATIVE
Comment: NEGATIVE
Comment: NEGATIVE
Comment: NORMAL
Neisseria Gonorrhea: NEGATIVE
Trichomonas: NEGATIVE

## 2024-01-14 LAB — HIV ANTIBODY (ROUTINE TESTING W REFLEX): HIV Screen 4th Generation wRfx: NONREACTIVE

## 2024-01-14 LAB — RPR: RPR Ser Ql: NONREACTIVE

## 2024-04-28 ENCOUNTER — Other Ambulatory Visit: Payer: Self-pay

## 2024-04-28 ENCOUNTER — Ambulatory Visit (HOSPITAL_COMMUNITY)
Admission: EM | Admit: 2024-04-28 | Discharge: 2024-04-28 | Disposition: A | Discharge status: Home / Self Care | Attending: Family Medicine | Admitting: Family Medicine

## 2024-04-28 ENCOUNTER — Encounter (HOSPITAL_COMMUNITY): Payer: Self-pay | Admitting: Emergency Medicine

## 2024-04-28 DIAGNOSIS — Z113 Encounter for screening for infections with a predominantly sexual mode of transmission: Secondary | ICD-10-CM | POA: Insufficient documentation

## 2024-04-28 LAB — HIV ANTIBODY (ROUTINE TESTING W REFLEX): HIV Screen 4th Generation wRfx: NONREACTIVE

## 2024-04-28 NOTE — ED Triage Notes (Signed)
 Denies any symptoms

## 2024-04-28 NOTE — Discharge Instructions (Signed)
 We have sent testing for sexually transmitted infections. We will notify you of any positive results once they are received. If required, we will prescribe any medications you might need.  Please refrain from all sexual activity for at least the next seven days.

## 2024-04-28 NOTE — ED Provider Notes (Signed)
  [facility]   [id number] 04/28/24 Arrival Time: 1222  ASSESSMENT & PLAN:  1. Screening for STDs (sexually transmitted diseases)    Reports no symptoms.   Discharge Instructions      We have sent testing for sexually transmitted infections. We will notify you of any positive results once they are received. If required, we will prescribe any medications you might need.  Please refrain from all sexual activity for at least the next seven days.     Pending: Labs Reviewed  HIV ANTIBODY (ROUTINE TESTING W REFLEX)  RPR  CYTOLOGY, (ORAL, ANAL, URETHRAL) ANCILLARY ONLY    Will notify of any positive results. Instructed to refrain from sexual activity for at least seven days.  Reviewed expectations re: course of current medical issues. Questions answered. Outlined signs and symptoms indicating need for more acute intervention. Patient verbalized understanding. After Visit Summary given.   SUBJECTIVE:  Shawn [name] is a 28 y.o. male who requests STI/STD testing. No symptoms. Recent new partner.   OBJECTIVE:  Vitals:   04/28/24 1325  BP: 110/69  Pulse: 89  Resp: 18  Temp: 98.1 F (36.7 C)  TempSrc: Oral  SpO2: 96%     General appearance: alert, cooperative, appears stated age and no distress GU: deferred Skin: warm and dry Psychological: alert and cooperative; normal mood and affect.    Labs Reviewed  HIV ANTIBODY (ROUTINE TESTING W REFLEX)  RPR  CYTOLOGY, (ORAL, ANAL, URETHRAL) ANCILLARY ONLY    No Known Allergies  Past Medical History:  Diagnosis Date   ADHD (attention deficit hyperactivity disorder)    Asthma    Environmental allergies    Family History  Problem Relation Age of Onset   Healthy Mother    Social History   Socioeconomic History   Marital status: Single    Spouse name: Not on file   Number of children: Not on file   Years of education: Not on file   Highest education level: Not on file  Occupational History   Not  on file  Tobacco Use   Smoking status: Never   Smokeless tobacco: Never  Vaping Use   Vaping status: Never Used  Substance and Sexual Activity   Alcohol use: Not Currently    Comment: a lot   Drug use: Not Currently    Types: Marijuana   Sexual activity: Yes    Birth control/protection: None, Condom    Comment: sometimes  Other Topics Concern   Not on file  Social History Narrative   Not on file   Social Drivers of Health   Financial Resource Strain: Not on file  Food Insecurity: Not on file  Transportation Needs: Not on file  Physical Activity: Not on file  Stress: Not on file  Social Connections: Not on file  Intimate Partner Violence: Not on file           Afton Albright, MD 04/28/24 1354

## 2024-04-29 LAB — RPR: RPR Ser Ql: NONREACTIVE

## 2024-04-30 LAB — CYTOLOGY, (ORAL, ANAL, URETHRAL) ANCILLARY ONLY
Chlamydia: NEGATIVE
Comment: NEGATIVE
Comment: NEGATIVE
Comment: NORMAL
Neisseria Gonorrhea: NEGATIVE
Trichomonas: NEGATIVE

## 2024-07-27 ENCOUNTER — Encounter (HOSPITAL_COMMUNITY): Payer: Self-pay

## 2024-07-27 ENCOUNTER — Ambulatory Visit (HOSPITAL_COMMUNITY)
Admission: EM | Admit: 2024-07-27 | Discharge: 2024-07-27 | Disposition: A | Discharge status: Home / Self Care | Attending: Emergency Medicine | Admitting: Emergency Medicine

## 2024-07-27 ENCOUNTER — Ambulatory Visit (INDEPENDENT_AMBULATORY_CARE_PROVIDER_SITE_OTHER)

## 2024-07-27 DIAGNOSIS — G8929 Other chronic pain: Secondary | ICD-10-CM

## 2024-07-27 DIAGNOSIS — M25511 Pain in right shoulder: Secondary | ICD-10-CM

## 2024-07-27 DIAGNOSIS — S4991XD Unspecified injury of right shoulder and upper arm, subsequent encounter: Secondary | ICD-10-CM

## 2024-07-27 NOTE — ED Triage Notes (Signed)
 Pt states his right shoulder is out of the socket states he was boxing.  Limited ROM due to pain.  States he has not been taking anything at home for the pain.

## 2024-07-27 NOTE — ED Provider Notes (Signed)
 [facility]    CSN: [id number] Arrival date & time: 07/27/24  1302      History   Chief Complaint Chief Complaint  Patient presents with   Shoulder Injury    HPI Kahleel Fadeley is a 28 y.o. male.   Presents today with right shoulder pain states that he has had previous injuries with this shoulder.  Patient was boxing and noticed the pain approximately a week ago.  Has not been able to lift his shoulder completely since the injury and feels a knot on the top of his shoulder.  Has not taken anything prior to arrival.    Past Medical History:  Diagnosis Date   ADHD (attention deficit hyperactivity disorder)    Asthma    Environmental allergies     There are no active problems to display for this patient.   History reviewed. No pertinent surgical history.     Home Medications    Prior to Admission medications   Not on File    Family History Family History  Problem Relation Age of Onset   Healthy Mother     Social History Social History   Tobacco Use   Smoking status: Never   Smokeless tobacco: Never  Vaping Use   Vaping status: Never Used  Substance Use Topics   Alcohol use: Not Currently    Comment: a lot   Drug use: Not Currently    Types: Marijuana     Allergies   Patient has no known allergies.   Review of Systems Review of Systems  Constitutional: Negative.   Respiratory: Negative.    Cardiovascular: Negative.   Gastrointestinal: Negative.   Musculoskeletal:  Positive for joint swelling.       A knot to his right shoulder not able to fully lift his right arm pain with movement  Skin:  Negative for color change.  Neurological: Negative.      Physical Exam Triage Vital Signs ED Triage Vitals  Encounter Vitals Group     BP 07/27/24 1409 (!) 140/68     Girls Systolic BP Percentile --      Girls Diastolic BP Percentile --      Boys Systolic BP Percentile --      Boys Diastolic BP Percentile --      Pulse Rate 07/27/24  1406 (!) 112     Resp 07/27/24 1406 16     Temp 07/27/24 1406 98.5 F (36.9 C)     Temp Source 07/27/24 1406 Oral     SpO2 07/27/24 1406 97 %     Weight --      Height --      Head Circumference --      Peak Flow --      Pain Score 07/27/24 1408 8     Pain Loc --      Pain Education --      Exclude from Growth Chart --    No data found.  Updated Vital Signs BP (!) 140/68   Pulse (!) 112   Temp 98.5 F (36.9 C) (Oral)   Resp 16   SpO2 97%   Visual Acuity Right Eye Distance:   Left Eye Distance:   Bilateral Distance:    Right Eye Near:   Left Eye Near:    Bilateral Near:     Physical Exam Constitutional:      Appearance: Normal appearance.  Cardiovascular:     Rate and Rhythm: Normal rate.  Pulmonary:  Effort: Pulmonary effort is normal.  Abdominal:     General: Abdomen is flat.  Musculoskeletal:        General: Swelling, tenderness and signs of injury present.     Cervical back: Normal range of motion.  Skin:    General: Skin is warm and dry.     Capillary Refill: Capillary refill takes less than 2 seconds.     Findings: No bruising or erythema.  Neurological:     General: No focal deficit present.     Mental Status: He is alert.      UC Treatments / Results  Labs (all labs ordered are listed, but only abnormal results are displayed) Labs Reviewed - No data to display  EKG   Radiology DG Shoulder Right Result Date: 07/27/2024 CLINICAL DATA:  Possible right shoulder dislocation while boxing. EXAM: RIGHT SHOULDER - 2+ VIEW COMPARISON:  Apr 03, 2020. FINDINGS: There is no evidence of dislocation involving the right gluteal joint. CT narrowing of the joint space is noted. There does appear to be some degree of separation involving the right acromioclavicular joint with resorption of distal end of right clavicle or possibly old trauma. IMPRESSION: No definite evidence of dislocation involving the right glenohumeral joint. There does appear to be some  degree of separation involving the right acromioclavicular joint with resorption of distal end of right clavicle or possibly old trauma. Electronically Signed   By: Lynwood Landy Raddle M.D.   On: 07/27/2024 14:48    Procedures Procedures (including critical care time)  Medications Ordered in UC Medications - No data to display  Initial Impression / Assessment and Plan / UC Course  I have reviewed the triage vital signs and the nursing notes.  Pertinent labs & imaging results that were available during my care of the patient were reviewed by me and considered in my medical decision making (see chart for details).     Follow-up with orthopedic Take Tylenol  or Motrin  as needed for pain Patient currently does see [facility] and was seeing PT he will follow-up with the same provider  Final Clinical Impressions(s) / UC Diagnoses   Final diagnoses:  Chronic right shoulder pain     Discharge Instructions      X-ray showed some spacing between the joint it could be something from your previous injury follow-up with [facility] for further testing      ED Prescriptions   None    PDMP not reviewed this encounter.   Merilee Andrea CROME, NP 07/27/24 [phone]

## 2024-07-27 NOTE — Discharge Instructions (Signed)
 X-ray showed some spacing between the joint it could be something from your previous injury follow-up with [facility] for further testing

## 2024-09-26 ENCOUNTER — Emergency Department (HOSPITAL_COMMUNITY): Admission: EM | Admit: 2024-09-26 | Discharge: 2024-09-26 | Discharge status: Against Medical Advice

## 2024-09-26 ENCOUNTER — Other Ambulatory Visit: Payer: Self-pay

## 2024-09-26 ENCOUNTER — Emergency Department (HOSPITAL_COMMUNITY)

## 2024-09-26 DIAGNOSIS — Y9371 Activity, boxing: Secondary | ICD-10-CM | POA: Diagnosis not present

## 2024-09-26 DIAGNOSIS — Z5321 Procedure and treatment not carried out due to patient leaving prior to being seen by health care provider: Secondary | ICD-10-CM | POA: Insufficient documentation

## 2024-09-26 DIAGNOSIS — W25XXXA Contact with sharp glass, initial encounter: Secondary | ICD-10-CM | POA: Insufficient documentation

## 2024-09-26 DIAGNOSIS — S51011A Laceration without foreign body of right elbow, initial encounter: Secondary | ICD-10-CM | POA: Insufficient documentation

## 2024-09-26 NOTE — ED Notes (Signed)
 Pt reports that he is leaving the department

## 2024-09-26 NOTE — ED Triage Notes (Signed)
 Pt here for lac to right arm. He is holding a piece of clothing to it at this time.  Bleeding appears controlled at this time.

## 2024-09-26 NOTE — ED Triage Notes (Signed)
 Pt. Stated, I WAS SHADOW BOXING AND HIT THE GLASS WITH MY RIGHT ARM. A ONE INCH EVULSION TO RIGHT ELBOW. Cleaned and redressed.

## 2024-09-27 ENCOUNTER — Encounter (HOSPITAL_COMMUNITY): Payer: Self-pay

## 2024-09-27 ENCOUNTER — Ambulatory Visit (HOSPITAL_COMMUNITY)
Admission: EM | Admit: 2024-09-27 | Discharge: 2024-09-27 | Disposition: A | Discharge status: Home / Self Care | Attending: Nurse Practitioner | Admitting: Nurse Practitioner

## 2024-09-27 DIAGNOSIS — S51011A Laceration without foreign body of right elbow, initial encounter: Secondary | ICD-10-CM | POA: Diagnosis not present

## 2024-09-27 MED ORDER — LIDOCAINE-EPINEPHRINE 1 %-1:100000 IJ SOLN
INTRAMUSCULAR | Status: AC
  Filled 2024-09-27: qty 1

## 2024-09-27 NOTE — ED Provider Notes (Signed)
 [facility]    CSN: [id number] Arrival date & time: 09/27/24  9051      History   Chief Complaint Chief Complaint  Patient presents with   Extremity Laceration   Elbow Injury    HPI Shawn [name] is a 28 y.o. male.   Discussed the use of AI scribe software for clinical note transcription with the patient, who gave verbal consent to proceed.   The patient presents for evaluation and repair of a laceration to the right elbow sustained last night while shadowboxing at home when he accidentally struck a piece of glass. He initially checked into the emergency department around 10 PM, where the wound was cleaned and redressed by the triage nurse and an elbow X-ray was performed. However, he left the ED AMA before being seen by a provider or having the wound repaired. He now presents to urgent care for definitive wound management. He reports pain at the injury site but is able to move the arm without difficulty. He denies numbness, tingling, or weakness in the affected area or extremity. He is unsure of his tetanus vaccination status and does not recall when he last received a tetanus shot.  The following sections of the patient's history were reviewed and updated as appropriate: allergies, current medications, past family history, past medical history, past social history, past surgical history, and problem list.      Past Medical History:  Diagnosis Date   ADHD (attention deficit hyperactivity disorder)    Asthma    Environmental allergies     There are no active problems to display for this patient.   History reviewed. No pertinent surgical history.     Home Medications    Prior to Admission medications   Not on File    Family History Family History  Problem Relation Age of Onset   Healthy Mother     Social History Social History   Tobacco Use   Smoking status: Never   Smokeless tobacco: Never  Vaping Use   Vaping status: Never Used  Substance  Use Topics   Alcohol use: Not Currently    Comment: a lot   Drug use: Not Currently    Types: Marijuana     Allergies   Patient has no known allergies.   Review of Systems Review of Systems  Skin:  Positive for wound.  Neurological:  Negative for weakness and numbness.  All other systems reviewed and are negative.    Physical Exam Triage Vital Signs ED Triage Vitals  Encounter Vitals Group     BP 09/27/24 1024 120/66     Girls Systolic BP Percentile --      Girls Diastolic BP Percentile --      Boys Systolic BP Percentile --      Boys Diastolic BP Percentile --      Pulse Rate 09/27/24 1024 64     Resp 09/27/24 1024 16     Temp 09/27/24 1024 97.7 F (36.5 C)     Temp Source 09/27/24 1024 Oral     SpO2 09/27/24 1024 97 %     Weight 09/27/24 1024 173 lb (78.5 kg)     Height 09/27/24 1024 6' (1.829 m)     Head Circumference --      Peak Flow --      Pain Score 09/27/24 1023 5     Pain Loc --      Pain Education --      Exclude from Growth Chart --  No data found.  Updated Vital Signs BP 120/66 (BP Location: Left Arm)   Pulse 64   Temp 97.7 F (36.5 C) (Oral)   Resp 16   Ht 6' (1.829 m)   Wt 173 lb (78.5 kg)   SpO2 97%   BMI 23.46 kg/m   Visual Acuity Right Eye Distance:   Left Eye Distance:   Bilateral Distance:    Right Eye Near:   Left Eye Near:    Bilateral Near:     Physical Exam Vitals reviewed.  Constitutional:      General: He is awake. He is not in acute distress.    Appearance: Normal appearance. He is well-developed. He is not ill-appearing, toxic-appearing or diaphoretic.  HENT:     Head: Normocephalic.     Right Ear: Hearing normal.     Left Ear: Hearing normal.     Nose: Nose normal.     Mouth/Throat:     Mouth: Mucous membranes are moist.  Eyes:     General: Vision grossly intact.     Conjunctiva/sclera: Conjunctivae normal.  Cardiovascular:     Rate and Rhythm: Normal rate and regular rhythm.     Heart sounds: Normal  heart sounds.  Pulmonary:     Effort: Pulmonary effort is normal.     Breath sounds: Normal breath sounds and air entry.  Musculoskeletal:        General: Normal range of motion.     Right elbow: Laceration present. No swelling, deformity or effusion. Normal range of motion. Tenderness present.     Cervical back: Normal range of motion and neck supple.     Comments: Laceration noted at the right elbow with localized tenderness. Full strength, intact sensation, and complete range of motion of the elbow and the entire right upper extremity. No focal neurological deficits are present, and neurovascular status is intact.   Skin:    General: Skin is warm and dry.     Findings: Laceration present.     Comments: A 6 cm diameter flap-type laceration is present over the right elbow. The skin flap is rolled downward but easily manipulated, with wound edges that can be re-approximated for suture repair. There is no surrounding erythema, swelling, or signs of infection. The wound bed appears clean with healthy pink granulation tissue, and no visible foreign bodies noted.   Neurological:     General: No focal deficit present.     Mental Status: He is alert and oriented to person, place, and time.  Psychiatric:        Speech: Speech normal.        Behavior: Behavior is cooperative.      UC Treatments / Results  Labs (all labs ordered are listed, but only abnormal results are displayed) Labs Reviewed - No data to display  EKG   Radiology DG Elbow Complete Right Result Date: 09/26/2024 EXAM: 3 VIEW(S) XRAY OF THE RIGHT ELBOW COMPARISON: None available. CLINICAL HISTORY: forieng object forieng object FINDINGS: BONES AND JOINTS: No acute fracture. No focal osseous lesion. No joint dislocation. No joint effusion. No definite radiopaque foreign body is noted. SOFT TISSUES: Probable soft tissue laceration seen medially. IMPRESSION: 1. No definite radiopaque foreign body. 2. Probable medial soft tissue  laceration. Electronically signed by: Lynwood Seip MD 09/26/2024 11:10 AM EST RP Workstation: [id number]    Procedures Laceration Repair  Date/Time: 09/27/2024 11:54 AM  Performed by: Iola Lukes, FNP Authorized by: Iola Lukes, FNP   Consent:    Consent  obtained:  Verbal   Consent given by:  Patient   Risks, benefits, and alternatives were discussed: yes     Risks discussed:  Infection, pain, poor cosmetic result and poor wound healing Universal protocol:    Procedure explained and questions answered to patient or proxy's satisfaction: yes     Patient identity confirmed:  Verbally with patient Anesthesia:    Anesthesia method:  Local infiltration   Local anesthetic:  Lidocaine  1% WITH epi Laceration details:    Location:  Shoulder/arm   Shoulder/arm location:  R elbow   Length (cm):  6 Pre-procedure details:    Preparation:  Imaging obtained to evaluate for foreign bodies (imaging from ER visit on 09/26/24 reviewed) Exploration:    Imaging outcome: foreign body not noted     Wound exploration: wound explored through full range of motion and entire depth of wound visualized     Contaminated: no   Treatment:    Area cleansed with:  Shur-Clens   Amount of cleaning:  Standard Skin repair:    Repair method:  Sutures   Suture size:  4-0   Suture material:  Prolene   Suture technique:  Simple interrupted   Number of sutures:  18 Approximation:    Approximation:  Close Repair type:    Repair type:  Complex Post-procedure details:    Dressing:  Antibiotic ointment and non-adherent dressing   Procedure completion:  Tolerated well, no immediate complications  (including critical care time)      Medications Ordered in UC Medications - No data to display  Initial Impression / Assessment and Plan / UC Course  I have reviewed the triage vital signs and the nursing notes.  Pertinent labs & imaging results that were available during my care of the patient were  reviewed by me and considered in my medical decision making (see chart for details).     The patient presents for repair of a right elbow laceration sustained after striking glass while shadowboxing. He was evaluated in the emergency department last night, where the wound was cleaned and redressed, and an elbow X-ray showed no acute fracture, dislocation, effusion, or radiopaque foreign body. He left the ED before wound repair could be completed. On exam today, he has full range of motion of the extremity with no numbness, tingling, or weakness. The laceration was repaired in clinic without any immediate complications. A Tdap vaccination was recommended due to uncertainty regarding his tetanus status, but the patient declined. Wound care instructions were reviewed, including keeping the area clean and dry and monitoring for signs of infection. He was advised to return in one week for suture removal and to seek earlier evaluation if he develops increased redness, swelling, drainage, fever, or worsening pain.  Today's evaluation has revealed no signs of a dangerous process. Discussed diagnosis with patient and/or guardian. Patient and/or guardian aware of their diagnosis, possible red flag symptoms to watch out for and need for close follow up. Patient and/or guardian understands verbal and written discharge instructions. Patient and/or guardian comfortable with plan and disposition.  Patient and/or guardian has a clear mental status at this time, good insight into illness (after discussion and teaching) and has clear judgment to make decisions regarding their care  Documentation was completed with the aid of voice recognition software. Transcription may contain typographical errors.  Final Clinical Impressions(s) / UC Diagnoses   Final diagnoses:  Laceration of right elbow, initial encounter     Discharge Instructions  You were seen today to repair the cut on your right elbow. The wound was  cleaned and stitched without any problems. The X-ray you had done in the emergency room last night showed no broken bones, no dislocation, and no glass or other foreign objects in the wound. Because you were unsure of your last tetanus shot, a Tdap vaccine was recommended, but you chose not to receive it today.  Keep the area clean and dry for the next 24 hours. After that, you should gently wash the area with mild soap and water once a day, but avoid soaking the elbow in water. Apply a small amount of antibiotic ointment and keep it covered with a clean bandage over the weekend. Starting Monday, you can leave the area uncovered as air allows for tissue and wound healing; however, cover the area if you are doing any activities that could possibly get it dirty. You may use Tylenol  or ibuprofen  for pain as needed. Try to avoid activities that could pull on the stitches or reopen the wound.  Please return to the clinic in one week for suture removal. If the wound becomes more red, swollen, warm, painful, starts to drain pus, or if you develop a fever, you should be seen sooner. Go to the emergency department immediately if you notice spreading redness up the arm, severe pain, loss of movement, numbness, or any sudden worsening of symptoms.      ED Prescriptions   None    PDMP not reviewed this encounter.   Iola Lukes, [place] 09/27/24 1208

## 2024-09-27 NOTE — ED Triage Notes (Signed)
 Patient presenting with a laceration to the right elbow. Onset yesterday. Patient was moving around broken glass and it caught the elbow.

## 2024-09-27 NOTE — Discharge Instructions (Signed)
 You were seen today to repair the cut on your right elbow. The wound was cleaned and stitched without any problems. The X-ray you had done in the emergency room last night showed no broken bones, no dislocation, and no glass or other foreign objects in the wound. Because you were unsure of your last tetanus shot, a Tdap vaccine was recommended, but you chose not to receive it today.  Keep the area clean and dry for the next 24 hours. After that, you should gently wash the area with mild soap and water once a day, but avoid soaking the elbow in water. Apply a small amount of antibiotic ointment and keep it covered with a clean bandage over the weekend. Starting Monday, you can leave the area uncovered as air allows for tissue and wound healing; however, cover the area if you are doing any activities that could possibly get it dirty. You may use Tylenol  or ibuprofen  for pain as needed. Try to avoid activities that could pull on the stitches or reopen the wound.  Please return to the clinic in one week for suture removal. If the wound becomes more red, swollen, warm, painful, starts to drain pus, or if you develop a fever, you should be seen sooner. Go to the emergency department immediately if you notice spreading redness up the arm, severe pain, loss of movement, numbness, or any sudden worsening of symptoms.

## 2024-10-04 ENCOUNTER — Ambulatory Visit (HOSPITAL_COMMUNITY)
Admission: EM | Admit: 2024-10-04 | Discharge: 2024-10-04 | Disposition: A | Discharge status: Home / Self Care | Attending: Family Medicine | Admitting: Family Medicine

## 2024-10-04 DIAGNOSIS — Z5189 Encounter for other specified aftercare: Secondary | ICD-10-CM | POA: Diagnosis not present

## 2024-10-04 MED ORDER — MUPIROCIN 2 % EX OINT: 1.0000 | TOPICAL_OINTMENT | Freq: Two times a day (BID) | CUTANEOUS | 0 refills | Status: DC

## 2024-10-04 NOTE — ED Notes (Signed)
 3 sutures removed, with wound dehiscence starting; remaining sutures left in place for MD re-eval.

## 2024-10-04 NOTE — ED Triage Notes (Addendum)
 Pt presents for suture removal of right posterior elbow. States he hasn't changed the dressing since placement of sutures, and hasn't performed wound care, so unsure if S/S infection.  Wound scabbed, but without S/S infection. Saline gauze placed to loosen scabbing.  Wound eval per Dr Rolinda -- states may proceed with suture removal.

## 2024-10-04 NOTE — ED Notes (Signed)
 Daily wound care discussed with pt; informed to return sooner if any concerns; reviewed return in 4-5 days for removal of remaining sutures.

## 2024-10-04 NOTE — ED Provider Notes (Signed)
  [facility]   [id number] 10/04/24 Arrival Time: 9083  ASSESSMENT & PLAN:  1. Visit for wound check    See RN note; wound of R elbow is opening a little after 3 sutures removed. No signs of infection. Will hold off for the next 4-5 days; f/u here for wound re-check and suture removal.  Meds ordered this encounter  Medications   mupirocin ointment (BACTROBAN) 2 %    Sig: Apply 1 Application topically 2 (two) times daily.    Dispense:  22 g    Refill:  0     Follow-up Information     [facility].   Specialty: Urgent Care Why: in 4-5 days for suture removal/wound check Contact information: [address] [place]  [address] [phone]                Reviewed expectations re: course of current medical issues. Questions answered. Outlined signs and symptoms indicating need for more acute intervention. Understanding verbalized. After Visit Summary given.    Rolinda Rogue, MD 10/04/24 [phone]

## 2024-10-09 ENCOUNTER — Encounter (HOSPITAL_COMMUNITY): Payer: Self-pay

## 2024-10-09 ENCOUNTER — Ambulatory Visit (HOSPITAL_COMMUNITY)
Admission: EM | Admit: 2024-10-09 | Discharge: 2024-10-09 | Disposition: A | Discharge status: Home / Self Care | Attending: Emergency Medicine | Admitting: Emergency Medicine

## 2024-10-09 DIAGNOSIS — Z4802 Encounter for removal of sutures: Secondary | ICD-10-CM | POA: Diagnosis not present

## 2024-10-09 MED ORDER — MUPIROCIN 2 % EX OINT: 1.0000 | TOPICAL_OINTMENT | Freq: Two times a day (BID) | CUTANEOUS | 0 refills | Status: AC

## 2024-10-09 NOTE — ED Notes (Signed)
 Patient waiting in car. Patient called on cell by Adina BIRCH,, CMA and patient stated they were on their way in.

## 2024-10-09 NOTE — Discharge Instructions (Signed)
 We removed your sutures today. Apply mupirocin  ointment twice daily as you are doing dressing changes for infection prevention. Keep this area clean dry and covered as best you can to prevent infection. If you develop swelling, pain, or drainage to the area return here for reevaluation.

## 2024-10-09 NOTE — ED Notes (Signed)
 Patient name called by Adina BIRCH., CMA x 1 in the lobby, but no answer.

## 2024-10-09 NOTE — ED Notes (Signed)
 Called patient on the phone and a woman answered and stated that the had to park by the ED due to not having any parking by the UC. I waited a few minutes. Will attempt to call patient again later.

## 2024-10-09 NOTE — ED Triage Notes (Signed)
 Patient here today for suture removal on his right elbow.

## 2024-10-09 NOTE — ED Provider Notes (Signed)
 [facility]    CSN: [id number] Arrival date & time: 10/09/24  9066      History   Chief Complaint Chief Complaint  Patient presents with   Suture / Staple Removal    HPI Shawn [name] is a 28 y.o. male.   Patient presents for suture removal to his right elbow.  Patient was previously seen after the sutures were placed for a recheck and was prescribed mupirocin  ointment for infection prevention, but patient states that he has not been using this and only applying petroleum jelly.  Patient denies any pain, swelling, or drainage from the area.  The history is provided by the patient and medical records.  Suture / Staple Removal    Past Medical History:  Diagnosis Date   ADHD (attention deficit hyperactivity disorder)    Asthma    Environmental allergies     There are no active problems to display for this patient.   History reviewed. No pertinent surgical history.     Home Medications    Prior to Admission medications   Medication Sig Start Date End Date Taking? Authorizing Provider  mupirocin  ointment (BACTROBAN ) 2 % Apply 1 Application topically 2 (two) times daily. 10/09/24   Johnie Rumaldo LABOR, NP    Family History Family History  Problem Relation Age of Onset   Healthy Mother     Social History Social History   Tobacco Use   Smoking status: Never   Smokeless tobacco: Never  Vaping Use   Vaping status: Never Used  Substance Use Topics   Alcohol use: Not Currently    Comment: a lot   Drug use: Not Currently    Types: Marijuana     Allergies   Patient has no known allergies.   Review of Systems Review of Systems  Per HPI  Physical Exam Triage Vital Signs ED Triage Vitals [10/09/24 1125]  Encounter Vitals Group     BP      Girls Systolic BP Percentile      Girls Diastolic BP Percentile      Boys Systolic BP Percentile      Boys Diastolic BP Percentile      Pulse      Resp      Temp      Temp src      SpO2       Weight      Height      Head Circumference      Peak Flow      Pain Score 0     Pain Loc      Pain Education      Exclude from Growth Chart    No data found.  Updated Vital Signs BP 121/72 (BP Location: Left Arm)   Pulse 60   Temp 98 F (36.7 C) (Oral)   Resp 16   SpO2 96%   Visual Acuity Right Eye Distance:   Left Eye Distance:   Bilateral Distance:    Right Eye Near:   Left Eye Near:    Bilateral Near:     Physical Exam Vitals and nursing note reviewed.  Constitutional:      General: He is awake. He is not in acute distress.    Appearance: Normal appearance. He is well-developed and well-groomed. He is not ill-appearing.  Skin:    General: Skin is warm and dry.     Findings: Laceration and wound present.     Comments: Skin flap-like laceration noted to right elbow with  sutures in place.  Wound is approximated some but still has open areas but overall appears to be healing well.  Neurological:     Mental Status: He is alert.  Psychiatric:        Behavior: Behavior is cooperative.      UC Treatments / Results  Labs (all labs ordered are listed, but only abnormal results are displayed) Labs Reviewed - No data to display  EKG   Radiology No results found.  Procedures Procedures (including critical care time)  Medications Ordered in UC Medications - No data to display  Initial Impression / Assessment and Plan / UC Course  I have reviewed the triage vital signs and the nursing notes.  Pertinent labs & imaging results that were available during my care of the patient were reviewed by me and considered in my medical decision making (see chart for details).     Patient is overall well-appearing.  Vitals are stable.  No signs of infection at this time.  Sutures removed.  Basic wound care provided.  Prescribed mupirocin  for infection for prevention.  Discussed proper wound care at home.  Discussed follow-up and return precautions. Final Clinical  Impressions(s) / UC Diagnoses   Final diagnoses:  Visit for suture removal     Discharge Instructions      We removed your sutures today. Apply mupirocin  ointment twice daily as you are doing dressing changes for infection prevention. Keep this area clean dry and covered as best you can to prevent infection. If you develop swelling, pain, or drainage to the area return here for reevaluation.    ED Prescriptions     Medication Sig Dispense Auth. Provider   mupirocin  ointment (BACTROBAN ) 2 % Apply 1 Application topically 2 (two) times daily. 22 g Johnie Flaming A, NP      PDMP not reviewed this encounter.   Johnie Flaming A, NP 10/09/24 1151
